# Patient Record
Sex: Male | Born: 1965
Health system: Southern US, Community
[De-identification: ages and names within clinical notes are randomized; demographics above are authoritative.]

## PROBLEM LIST (undated history)

## (undated) DIAGNOSIS — E785 Hyperlipidemia, unspecified: Secondary | ICD-10-CM

## (undated) DIAGNOSIS — Z8669 Personal history of other diseases of the nervous system and sense organs: Secondary | ICD-10-CM

## (undated) DIAGNOSIS — N189 Chronic kidney disease, unspecified: Secondary | ICD-10-CM

## (undated) DIAGNOSIS — I10 Essential (primary) hypertension: Secondary | ICD-10-CM

## (undated) DIAGNOSIS — E119 Type 2 diabetes mellitus without complications: Secondary | ICD-10-CM

## (undated) HISTORY — DX: Chronic kidney disease, unspecified: N18.9

## (undated) HISTORY — DX: Personal history of other diseases of the nervous system and sense organs: Z86.69

## (undated) HISTORY — PX: HERNIA REPAIR: SHX51

## (undated) HISTORY — DX: Hyperlipidemia, unspecified: E78.5

## (undated) HISTORY — PX: TENDON REPAIR: SHX5111

## (undated) HISTORY — PX: KNEE ARTHROSCOPY: SUR90

---

## 1998-07-28 ENCOUNTER — Encounter: Payer: Self-pay | Admitting: Neurosurgery

## 1998-07-28 ENCOUNTER — Inpatient Hospital Stay (HOSPITAL_COMMUNITY): Admission: RE | Admit: 1998-07-28 | Discharge: 1998-07-29 | Payer: Self-pay | Admitting: Neurosurgery

## 2002-10-02 ENCOUNTER — Ambulatory Visit (HOSPITAL_BASED_OUTPATIENT_CLINIC_OR_DEPARTMENT_OTHER): Admission: RE | Admit: 2002-10-02 | Discharge: 2002-10-02 | Payer: Self-pay | Admitting: Orthopedic Surgery

## 2012-12-09 ENCOUNTER — Emergency Department (HOSPITAL_COMMUNITY)
Admission: EM | Admit: 2012-12-09 | Discharge: 2012-12-09 | Disposition: A | Discharge status: Home / Self Care | Payer: PRIVATE HEALTH INSURANCE | Attending: Emergency Medicine | Admitting: Emergency Medicine

## 2012-12-09 ENCOUNTER — Emergency Department (HOSPITAL_COMMUNITY): Payer: PRIVATE HEALTH INSURANCE

## 2012-12-09 ENCOUNTER — Encounter (HOSPITAL_COMMUNITY): Payer: Self-pay | Admitting: Emergency Medicine

## 2012-12-09 DIAGNOSIS — S60312A Abrasion of left thumb, initial encounter: Secondary | ICD-10-CM

## 2012-12-09 DIAGNOSIS — I10 Essential (primary) hypertension: Secondary | ICD-10-CM | POA: Insufficient documentation

## 2012-12-09 DIAGNOSIS — Y9389 Activity, other specified: Secondary | ICD-10-CM | POA: Insufficient documentation

## 2012-12-09 DIAGNOSIS — E119 Type 2 diabetes mellitus without complications: Secondary | ICD-10-CM | POA: Insufficient documentation

## 2012-12-09 DIAGNOSIS — IMO0002 Reserved for concepts with insufficient information to code with codable children: Secondary | ICD-10-CM | POA: Insufficient documentation

## 2012-12-09 DIAGNOSIS — Z23 Encounter for immunization: Secondary | ICD-10-CM | POA: Insufficient documentation

## 2012-12-09 DIAGNOSIS — Y929 Unspecified place or not applicable: Secondary | ICD-10-CM | POA: Insufficient documentation

## 2012-12-09 DIAGNOSIS — W268XXA Contact with other sharp object(s), not elsewhere classified, initial encounter: Secondary | ICD-10-CM | POA: Insufficient documentation

## 2012-12-09 DIAGNOSIS — Z79899 Other long term (current) drug therapy: Secondary | ICD-10-CM | POA: Insufficient documentation

## 2012-12-09 HISTORY — DX: Essential (primary) hypertension: I10

## 2012-12-09 HISTORY — DX: Type 2 diabetes mellitus without complications: E11.9

## 2012-12-09 MED ORDER — TETANUS-DIPHTH-ACELL PERTUSSIS 5-2.5-18.5 LF-MCG/0.5 IM SUSP
0.5000 mL | Freq: Once | INTRAMUSCULAR | Status: AC
  Administered 2012-12-09: 0.5 mL via INTRAMUSCULAR
  Filled 2012-12-09: qty 0.5

## 2012-12-09 NOTE — ED Provider Notes (Signed)
Medical screening examination/treatment/procedure(s) were performed by non-physician practitioner and as supervising physician I was immediately available for consultation/collaboration.  Mikaelah Trostle F Gloriajean Okun, MD 12/09/12 2024 

## 2012-12-09 NOTE — ED Notes (Signed)
Pt was lifting piece of equipment, metal sliver in pad of thumb l/hand

## 2012-12-09 NOTE — ED Provider Notes (Signed)
CSN: [id number]     Arrival date & time 12/09/12  1414 History   First MD Initiated Contact with Patient 12/09/12 1426     Chief Complaint  Patient presents with  . Foreign Body    sliver in metal in thumb  . Extremity Laceration   (Consider location/radiation/quality/duration/timing/severity/associated sxs/prior Treatment) Patient is a 47 y.o. male presenting with foreign body. The history is provided by the patient. No language interpreter was used.  Foreign Body Suspected object:  Metal Associated symptoms comment:  He caught his left thumb in a piece of equipment causing deep abrasion to palmar distal thumb. He presents with concern for metallic foreign body in the wound.   Past Medical History  Diagnosis Date  . Hypertension   . Diabetes mellitus without complication    Past Surgical History  Procedure Laterality Date  . Hernia repair    . Knee arthroscopy    . Tendon repair      r/acilles   Family History  Problem Relation Age of Onset  . Diabetes Mother   . Hyperlipidemia Mother   . Hypertension Mother   . Diabetes Father   . Hyperlipidemia Father   . Hypertension Father   . Cancer Other    History  Substance Use Topics  . Smoking status: Never Smoker   . Smokeless tobacco: Not on file  . Alcohol Use: Yes    Review of Systems  Constitutional: Negative for fever.  Skin: Positive for wound.    Allergies  Bee venom; Ivp dye; and Januvia  Home Medications   Current Outpatient Rx  Name  Route  Sig  Dispense  Refill  . EPINEPHrine (EPIPEN) 0.3 mg/0.3 mL SOAJ injection   Intramuscular   Inject 0.3 mg into the muscle once.         . fenofibrate (TRICOR) 48 MG tablet   Oral   Take 48 mg by mouth at bedtime.         . fish oil-omega-3 fatty acids 1000 MG capsule   Oral   Take 1 g by mouth every morning.         Marland Kitchen lisinopril-hydrochlorothiazide (PRINZIDE,ZESTORETIC) 20-12.5 MG per tablet   Oral   Take 1 tablet by mouth at bedtime.         .  metformin (FORTAMET) 1000 MG (OSM) 24 hr tablet   Oral   Take 1,000 mg by mouth 2 (two) times daily with a meal.         . simvastatin (ZOCOR) 20 MG tablet   Oral   Take 20 mg by mouth at bedtime.          BP 154/83  Pulse 85  Temp(Src) 99.2 F (37.3 C) (Oral)  Resp 16  SpO2 99% Physical Exam  Constitutional: He appears well-developed and well-nourished. No distress.  Pulmonary/Chest: Effort normal.  Skin:  Superficial deep abrasion distal pad of left thumb. No palpable foreign body. No redness or swelling. No active bleeding. FROM.    ED Course  Procedures (including critical care time) Labs Review Labs Reviewed - No data to display Imaging Review Dg Finger Thumb Left  12/09/2012   *RADIOLOGY REPORT*  Clinical Data: Laceration, evaluate for foreign body  LEFT THUMB 2+V  Comparison: None.  Findings: Three views of the thumb demonstrate two tiny linear radiopaque foreign bodies in the superficial soft tissues of the volar surface of the distal thumb pad.  There is no underlying osseous injury or abnormality.  There is mild soft  tissue swelling in the region consistent with the clinical history of laceration.  IMPRESSION:  Two adjacent tiny (1 mm) radiopaque foreign bodies in the superficial soft tissues of the volar surface of the pad of the thumb.  No underlying osseous injury.   Original Report Authenticated By: Malachy Moan, M.D.    MDM  No diagnosis found. 1. Abrasion left thumb  Superficial FB's visualized on x-ray. Wound was cleaned with brush cleanser. Do not suspect deeper foreign body. Tetanus updated. Stable for discharge.     Arnoldo Hooker, PA-C 12/09/12 1532

## 2013-12-30 IMAGING — CR DG FINGER THUMB 2+V*L*
3 series · 3 of 3 positions shown · non-contrast
Comparison: None.

CLINICAL DATA: Laceration, evaluate for foreign body

LEFT THUMB 2+V

[x finger obl left]
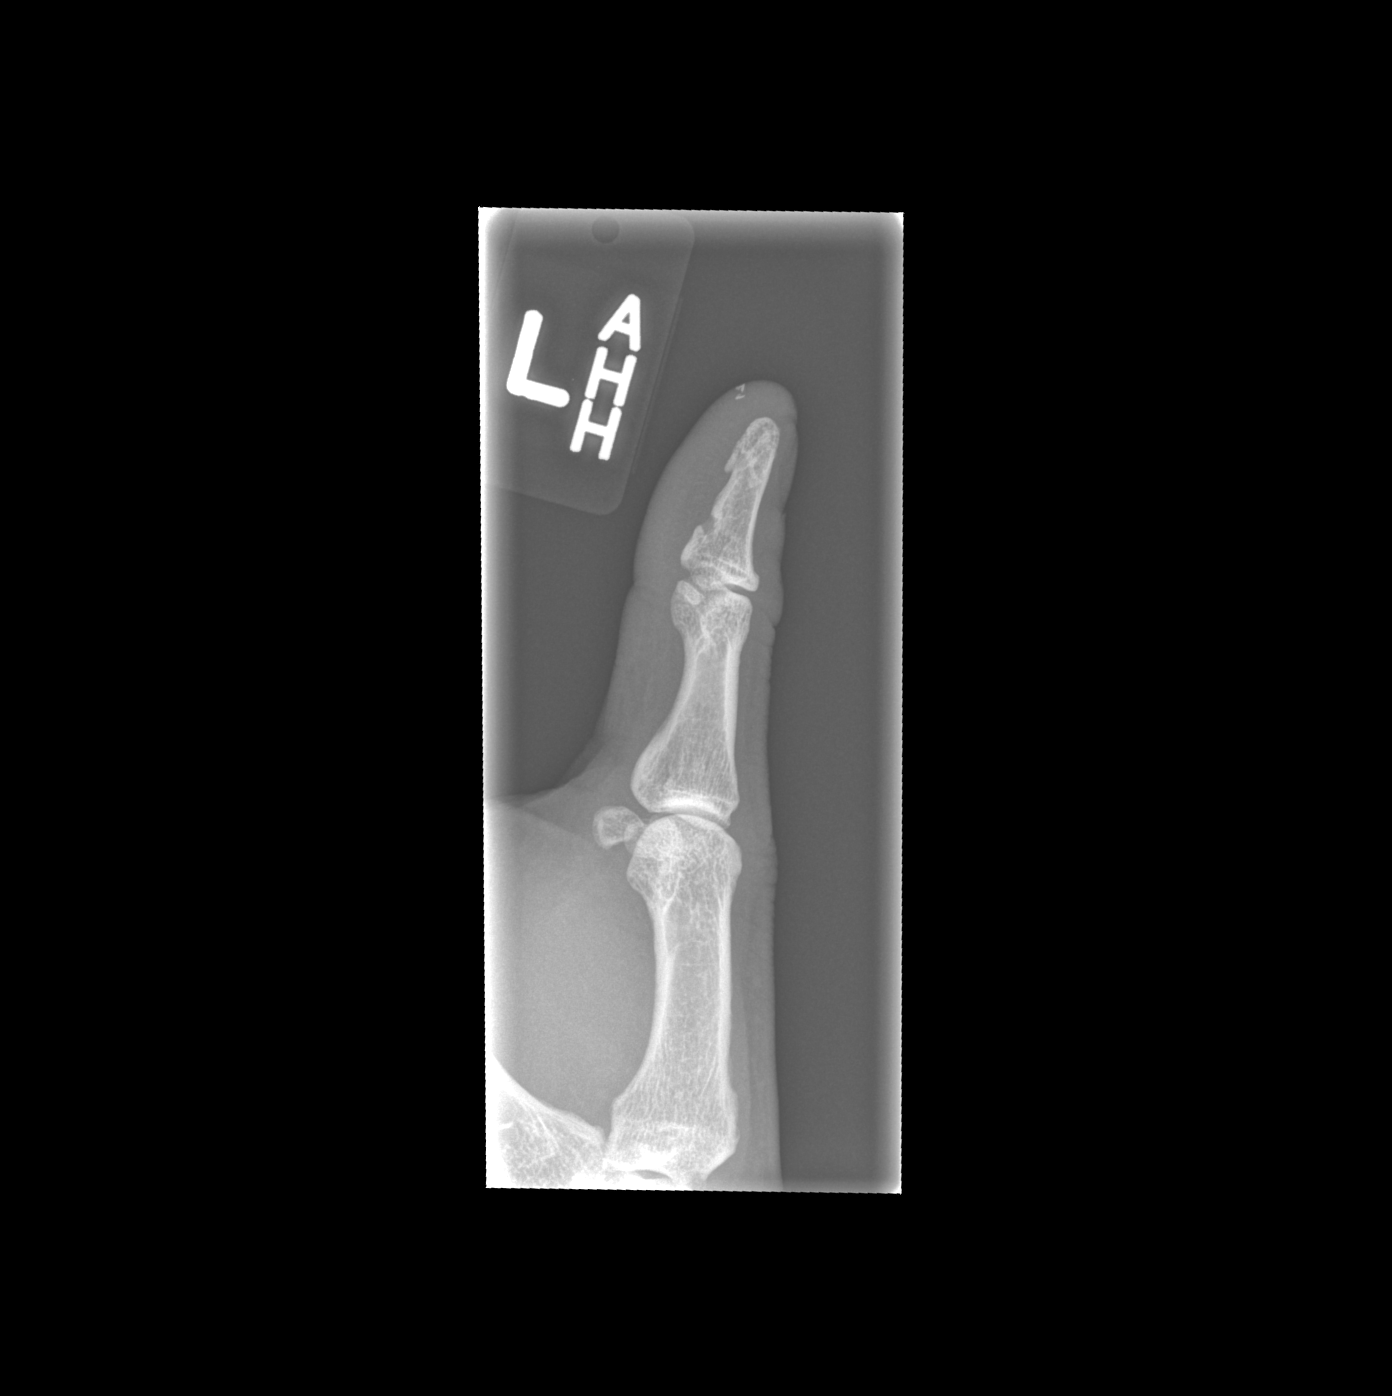

[x finger lat left]
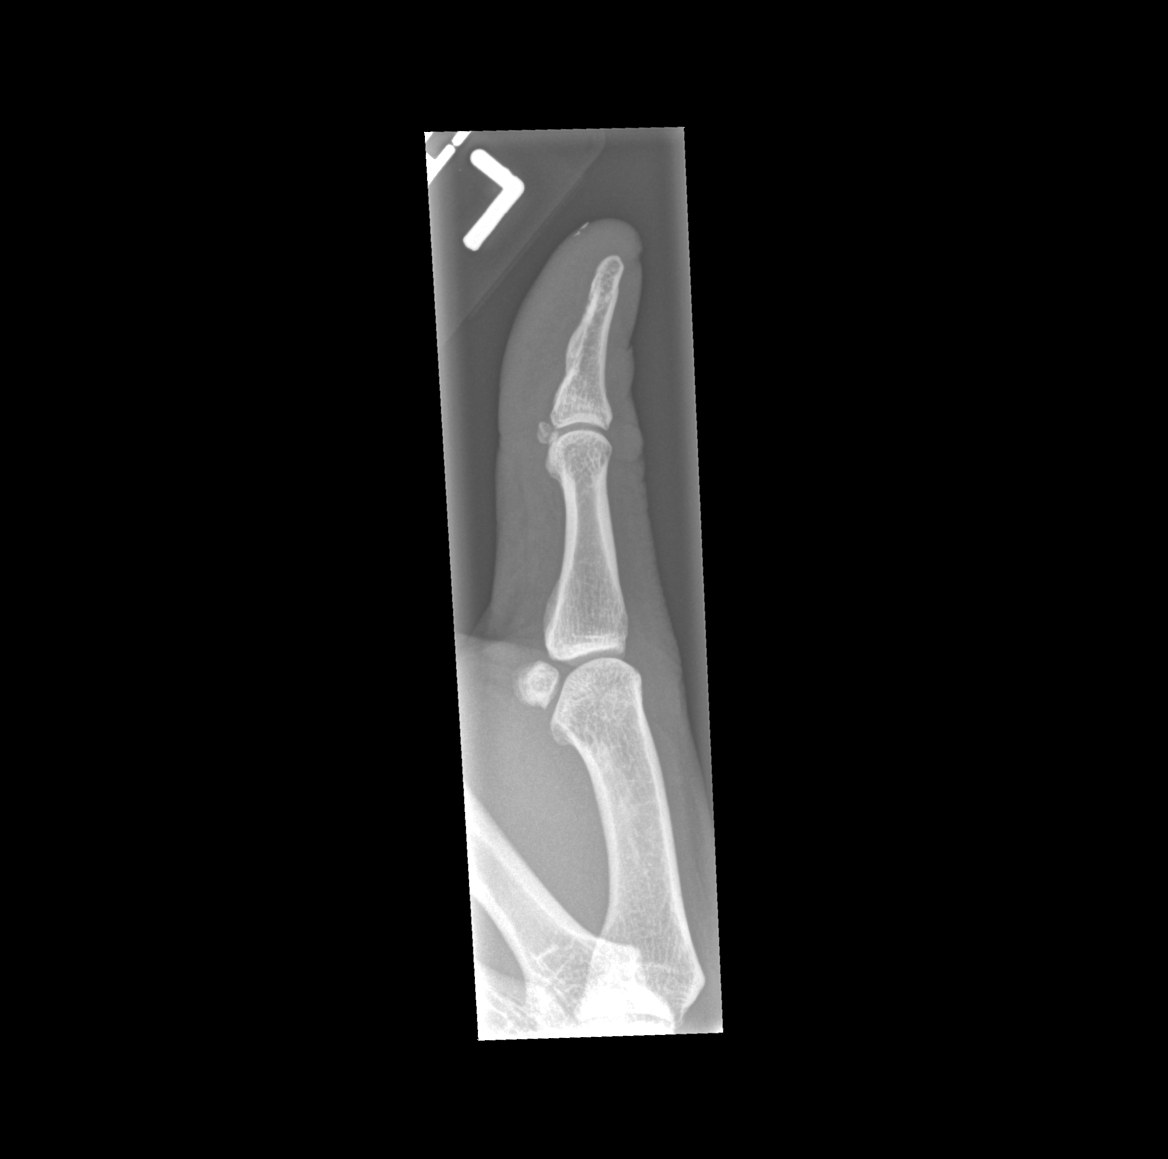

[x finger pa left]
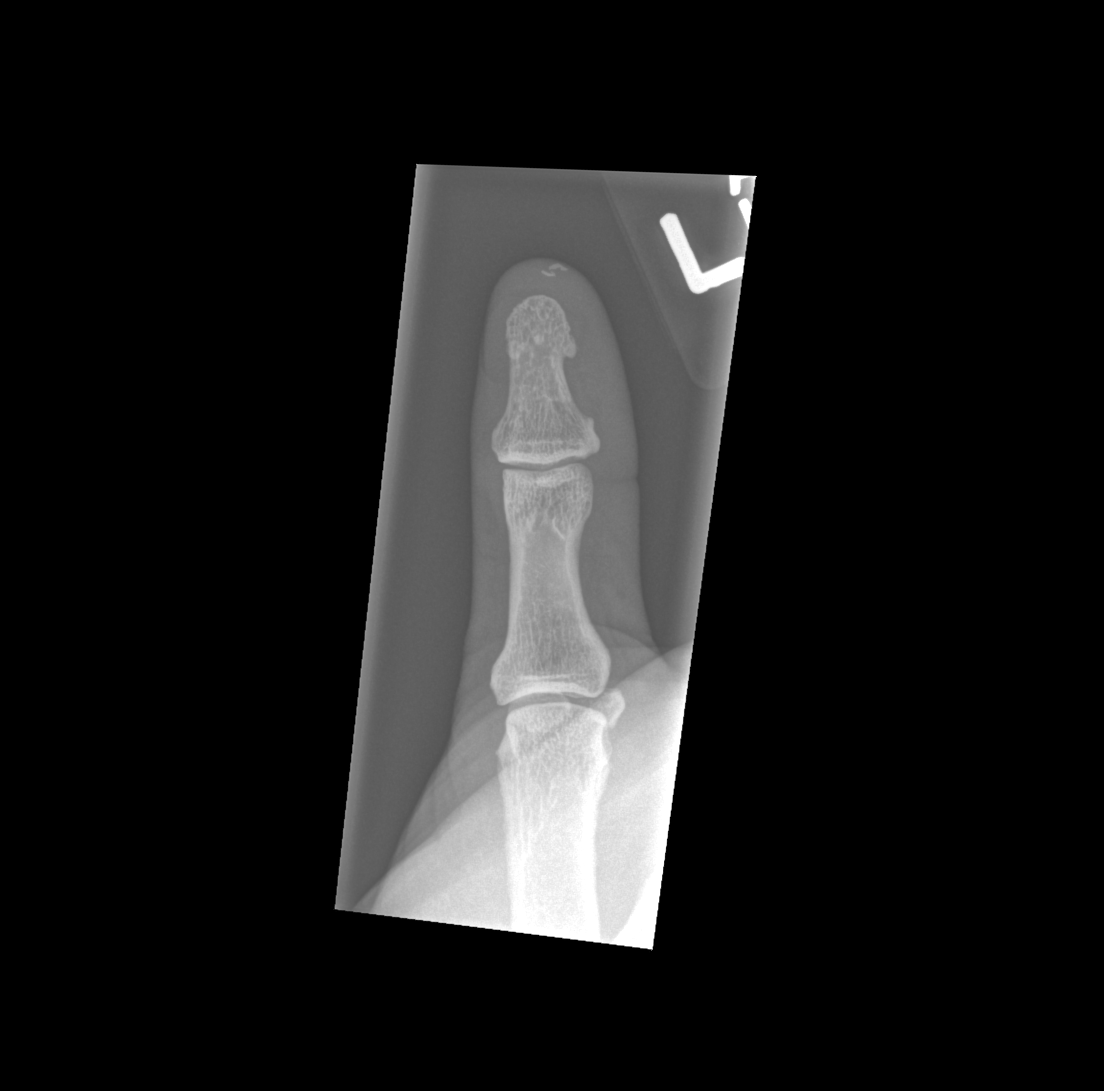

[3 of 3 positions shown; findings below may reference images not displayed]

FINDINGS: Three views of the thumb demonstrate two tiny linear
radiopaque foreign bodies in the superficial soft tissues of the
volar surface of the distal thumb pad.  There is no underlying
osseous injury or abnormality.  There is mild soft tissue swelling
in the region consistent with the clinical history of laceration.
IMPRESSION: Two adjacent tiny (1 mm) radiopaque foreign bodies in the
superficial soft tissues of the volar surface of the pad of the
thumb.

No underlying osseous injury.

## 2014-08-02 LAB — BASIC METABOLIC PANEL
BUN: 23 mg/dL — AB (ref 4–21)
CREATININE: 1.6 mg/dL — AB (ref 0.6–1.3)
GLUCOSE: 164 mg/dL
POTASSIUM: 4.3 mmol/L (ref 3.4–5.3)
SODIUM: 139 mmol/L (ref 137–147)

## 2014-08-02 LAB — HEMOGLOBIN A1C: Hgb A1c MFr Bld: 8.7 % — AB (ref 4.0–6.0)

## 2014-10-07 ENCOUNTER — Encounter: Payer: Self-pay | Admitting: Internal Medicine

## 2014-10-08 ENCOUNTER — Encounter: Payer: Self-pay | Admitting: Internal Medicine

## 2014-10-08 ENCOUNTER — Ambulatory Visit (INDEPENDENT_AMBULATORY_CARE_PROVIDER_SITE_OTHER): Payer: 59 | Admitting: Internal Medicine

## 2014-10-08 ENCOUNTER — Other Ambulatory Visit (INDEPENDENT_AMBULATORY_CARE_PROVIDER_SITE_OTHER): Payer: 59 | Admitting: *Deleted

## 2014-10-08 VITALS — BP 140/74 | HR 93 | Temp 98.2°F | Resp 12 | Ht 70.0 in | Wt 235.0 lb

## 2014-10-08 DIAGNOSIS — N183 Chronic kidney disease, stage 3 unspecified: Secondary | ICD-10-CM

## 2014-10-08 DIAGNOSIS — E1122 Type 2 diabetes mellitus with diabetic chronic kidney disease: Secondary | ICD-10-CM | POA: Diagnosis not present

## 2014-10-08 LAB — POCT GLYCOSYLATED HEMOGLOBIN (HGB A1C): HEMOGLOBIN A1C: 8

## 2014-10-08 MED ORDER — CANAGLIFLOZIN 100 MG PO TABS: 50.0000 mg | ORAL_TABLET | Freq: Every day | ORAL | Status: DC

## 2014-10-08 NOTE — Patient Instructions (Signed)
Please continue: - Metformin XR 1000 mg 2x a day, with meals  Decrease: - Glipizide XL to 10 mg in am  Start: - Invokana 50 mg daily in am  Please return in 1.5 month with your sugar log.   PATIENT INSTRUCTIONS FOR TYPE 2 DIABETES:  **Please join MyChart!** - see attached instructions about how to join if you have not done so already.  DIET AND EXERCISE Diet and exercise is an important part of diabetic treatment.  We recommended aerobic exercise in the form of brisk walking (working between 40-60% of maximal aerobic capacity, similar to brisk walking) for 150 minutes per week (such as 30 minutes five days per week) along with 3 times per week performing 'resistance' training (using various gauge rubber tubes with handles) 5-10 exercises involving the major muscle groups (upper body, lower body and core) performing 10-15 repetitions (or near fatigue) each exercise. Start at half the above goal but build slowly to reach the above goals. If limited by weight, joint pain, or disability, we recommend daily walking in a swimming pool with water up to waist to reduce pressure from joints while allow for adequate exercise.    BLOOD GLUCOSES Monitoring your blood glucoses is important for continued management of your diabetes. Please check your blood glucoses 2-4 times a day: fasting, before meals and at bedtime (you can rotate these measurements - e.g. one day check before the 3 meals, the next day check before 2 of the meals and before bedtime, etc.).   HYPOGLYCEMIA (low blood sugar) Hypoglycemia is usually a reaction to not eating, exercising, or taking too much insulin/ other diabetes drugs.  Symptoms include tremors, sweating, hunger, confusion, headache, etc. Treat IMMEDIATELY with 15 grams of Carbs: . 4 glucose tablets .  cup regular juice/soda . 2 tablespoons raisins . 4 teaspoons sugar . 1 tablespoon honey Recheck blood glucose in 15 mins and repeat above if still symptomatic/blood  glucose <100.  RECOMMENDATIONS TO REDUCE YOUR RISK OF DIABETIC COMPLICATIONS: * Take your prescribed MEDICATION(S) * Follow a DIABETIC diet: Complex carbs, fiber rich foods, (monounsaturated and polyunsaturated) fats * AVOID saturated/trans fats, high fat foods, >2,300 mg salt per day. * EXERCISE at least 5 times a week for 30 minutes or preferably daily.  * DO NOT SMOKE OR DRINK more than 1 drink a day. * Check your FEET every day. Do not wear tightfitting shoes. Contact us if you develop an ulcer * See your EYE doctor once a year or more if needed * Get a FLU shot once a year * Get a PNEUMONIA vaccine once before and once after age 51 years  GOALS:  * Your Hemoglobin A1c of <7%  * fasting sugars need to be <130 * after meals sugars need to be <180 (2h after you start eating) * Your Systolic BP should be 140 or lower  * Your Diastolic BP should be 80 or lower  * Your HDL (Good Cholesterol) should be 40 or higher  * Your LDL (Bad Cholesterol) should be 100 or lower. * Your Triglycerides should be 150 or lower  * Your Urine microalbumin (kidney function) should be <30 * Your Body Mass Index should be 25 or lower    Please consider the following ways to cut down carbs and fat and increase fiber and micronutrients in your diet: - substitute whole grain for white bread or pasta - substitute brown rice for white rice - substitute 90-calorie flat bread pieces for slices of bread when possible -  substitute sweet potatoes or yams for white potatoes - substitute humus for margarine - substitute tofu for cheese when possible - substitute almond or rice milk for regular milk (would not drink soy milk daily due to concern for soy estrogen influence on breast cancer risk) - substitute dark chocolate for other sweets when possible - substitute water - can add lemon or orange slices for taste - for diet sodas (artificial sweeteners will trick your body that you can eat sweets without getting  calories and will lead you to overeating and weight gain in the long run) - do not skip breakfast or other meals (this will slow down the metabolism and will result in more weight gain over time)  - can try smoothies made from fruit and almond/rice milk in am instead of regular breakfast - can also try old-fashioned (not instant) oatmeal made with almond/rice milk in am - order the dressing on the side when eating salad at a restaurant (pour less than half of the dressing on the salad) - eat as little meat as possible - can try juicing, but should not forget that juicing will get rid of the fiber, so would alternate with eating raw veg./fruits or drinking smoothies - use as little oil as possible, even when using olive oil - can dress a salad with a mix of balsamic vinegar and lemon juice, for e.g. - use agave nectar, stevia sugar, or regular sugar rather than artificial sweateners - steam or broil/roast veggies  - snack on veggies/fruit/nuts (unsalted, preferably) when possible, rather than processed foods - reduce or eliminate aspartame in diet (it is in diet sodas, chewing gum, etc) Read the labels!  Try to read Dr. Katherina Right book: "Program for Reversing Diabetes" for other ideas for healthy eating.

## 2014-10-08 NOTE — Progress Notes (Signed)
Patient ID: Jonathan Dalton, male   DOB: 10/28/1965, 49 y.o.   MRN: [id number]  HPI: Jonathan BarkerWilliam B Hitchner is a 49 y.o.-year-old male, referred by his PCP, Dr. Deatra JamesVyvyan Sun, for management of DM2, dx in 1994, non-insulin-dependent, uncontrolled, with complications (CKD stage 3).  Last hemoglobin A1c was: Lab Results  Component Value Date   HGBA1C 8.7* 08/02/2014  He started to increase his exercise after the above HbA1c.  Pt is on a regimen of: - Metformin XR 1000 mg 2x a day, with meals - Glipizide XL 10 mg >> increased to 2x a day in 07/2014 >> some low CBGs (hot, shaky).  He tried Januvia 2013 >> GERD, lack of appetite.  Pt checks his sugars 1-2x a day and they are: - am: 76-120s, 140s - 2h after b'fast: n/c - before lunch: n/c - 2h after lunch: n/c - before dinner: 90s-130 - 2h after dinner: n/c - bedtime: n/c - nighttime: n/c No lows. Lowest sugar was 76; ? hypoglycemia awareness. Highest sugar was 251.  Glucometer: ReliOn  Pt's meals are: - Breakfast: oatmeal + bacon or cold cereal + 2% milk - Lunch: sandwich or dinner leftovers - Dinner: meat + veggies + starch - Snacks: fruit x2 He lost 5 lbs since 07/2014. He exercises 3-4x a week.  - + CKD stage 3, last BUN/creatinine:  Lab Results  Component Value Date   BUN 23* 08/02/2014   CREATININE 1.6* 08/02/2014  GFR 55 On Lisinopril 20. - last set of lipids: 05/03/2014: 154/193/26/90  On Simvastatin 20. - last eye exam was in 2012. ? DR, but in 2010 >> sent to see a retina specialist.  - no numbness and tingling in his feet.  Pt has FH of DM in PGF, MGGF, cousins.   He also has HTN, HL.  ROS: Constitutional: no weight gain/loss, no fatigue, no subjective hyperthermia/hypothermia Eyes: no blurry vision, no xerophthalmia ENT: no sore throat, no nodules palpated in throat, no dysphagia/odynophagia, no hoarseness Cardiovascular: no CP/SOB/palpitations/leg swelling Respiratory: no cough/SOB Gastrointestinal: no  N/V/D/C Musculoskeletal: no muscle/joint aches Skin: no rashes Neurological: no tremors/numbness/tingling/dizziness Psychiatric: no depression/anxiety  Past Medical History  Diagnosis Date  . Hypertension   . Diabetes mellitus without complication   . Hyperlipidemia   . History of seizures as a child   . Chronic kidney disease     Stage III    Past Surgical History  Procedure Laterality Date  . Hernia repair    . Knee arthroscopy    . Tendon repair      r/acilles   History   Social History  . Marital Status: Married    Spouse Name: N/A  . Number of Children: 2   Occupational History  . Nurse [employer]   Social History Main Topics  . Smoking status: Never Smoker   . Smokeless tobacco: No  . Alcohol Use: Yes, beer, liquor 2-3x a week - 3 drinks  . Drug Use: No   Current Outpatient Prescriptions on File Prior to Visit  Medication Sig Dispense Refill  . Beclomethasone Dipropionate (QNASL) 80 MCG/ACT AERS     . EPINEPHrine (EPIPEN) 0.3 mg/0.3 mL SOAJ injection Inject 0.3 mg into the muscle once.    . fenofibrate (TRICOR) 48 MG tablet Take 145 mg by mouth at bedtime.     . fish oil-omega-3 fatty acids 1000 MG capsule Take 1 g by mouth every morning.    Marland Kitchen. glipiZIDE (GLUCOTROL XL) 10 MG 24 hr tablet Take 10 mg  by mouth 2 (two) times daily with a meal.    . lisinopril-hydrochlorothiazide (PRINZIDE,ZESTORETIC) 20-12.5 MG per tablet Take 1 tablet by mouth at bedtime.    . metformin (FORTAMET) 1000 MG (OSM) 24 hr tablet Take 1,000 mg by mouth 2 (two) times daily with a meal.    . simvastatin (ZOCOR) 20 MG tablet Take 20 mg by mouth at bedtime.     No current facility-administered medications on file prior to visit.   Allergies  Allergen Reactions  . Bee Venom Anaphylaxis  . Ivp Dye [Iodinated Diagnostic Agents] Hives  . Januvia [Sitagliptin] Other (See Comments)    Induces acid reflux, no appetite   . Fluorescein Rash   Family History  Problem Relation Age of Onset   . Diabetes Mother   . Hyperlipidemia Mother   . Hypertension Mother   . Diabetes Father   . Hyperlipidemia Father   . Hypertension Father   . Cancer Other   . Hypertension Maternal Grandmother   . Hyperlipidemia Maternal Grandmother   . Cancer Maternal Grandmother     Lung cancer  . Hyperlipidemia Maternal Grandfather   . Hypertension Maternal Grandfather   . CVA Maternal Grandfather   . Cancer Maternal Grandfather     Lung cancer  . Heart disease Paternal Grandmother     MI   . Stroke Paternal Grandmother   . Hypertension Paternal Grandmother   . Diabetes Paternal Grandfather   . Hyperlipidemia Paternal Grandfather    PE: BP 140/74 mmHg  Pulse 93  Temp(Src) 98.2 F (36.8 C) (Oral)  Resp 12  Ht  (1.778 m)  Wt 235 lb (106.595 kg)  BMI 33.72 kg/m2  SpO2 98% Wt Readings from Last 3 Encounters:  10/08/14 235 lb (106.595 kg)   Constitutional: overweight, in NAD Eyes: PERRLA, EOMI, no exophthalmos ENT: moist mucous membranes, no thyromegaly, no cervical lymphadenopathy Cardiovascular: RRR (no tachycardia at the end of the visit), No MRG Respiratory: CTA B Gastrointestinal: abdomen soft, NT, ND, BS+ Musculoskeletal: no deformities, strength intact in all 4 Skin: moist, warm, no rashes Neurological: no tremor with outstretched hands, DTR normal in all 4  ASSESSMENT: 1. DM2, non-insulin-dependent, uncontrolled, with complications - CKD stage 3  - ? DR  PLAN:  1. Patient with long-standing, uncontrolled diabetes, on oral antidiabetic regimen, which became insufficient. He percieves low CBGs after increasing Glipizide and increasing is exercise >> will decrease Glipizide XL to once a day and add Invokana - lower dose 2/2 CKD. I would have liked to add Tradjenta but he felt poorly on Januvia in the past. I strongly encouraged him to continue exercise and being mindful of his diet. - I suggested to:  Patient Instructions  Please continue: - Metformin XR 1000 mg 2x  a day, with meals  Decrease: - Glipizide XL to 10 mg in am  Start: - Invokana 50 mg daily in am  Please return in 1.5 month with your sugar log.   - we discussed about SEs of Invokana, which are: dizziness (advised to be careful when stands from sitting position), decreased BP - usually not < normal (BP today is not low), and fungal UTIs (advised to let me know if develops one).  - at next visit, will check BMP and increase Invokana dose to 100 mg daily if unchanged - advised to schedule a new appt with Dr Elmer Picker (ophthalm.) - advised him to start checking sugars at different times of the day - check 2 times a day, rotating checks -  given sugar log and advised how to fill it and to bring it at next appt  - given foot care handout and explained the principles  - given instructions for hypoglycemia management "15-15 rule"  - checked HbA1c today >> 8% (decreased!) - Return to clinic in 1.5 mo with sugar log

## 2014-11-20 ENCOUNTER — Ambulatory Visit (INDEPENDENT_AMBULATORY_CARE_PROVIDER_SITE_OTHER): Payer: 59 | Admitting: Internal Medicine

## 2014-11-20 ENCOUNTER — Encounter: Payer: Self-pay | Admitting: Internal Medicine

## 2014-11-20 VITALS — BP 144/74 | HR 77 | Temp 98.1°F | Resp 12 | Wt 234.0 lb

## 2014-11-20 DIAGNOSIS — N183 Chronic kidney disease, stage 3 unspecified: Secondary | ICD-10-CM

## 2014-11-20 DIAGNOSIS — E1122 Type 2 diabetes mellitus with diabetic chronic kidney disease: Secondary | ICD-10-CM | POA: Diagnosis not present

## 2014-11-20 MED ORDER — GLIPIZIDE ER 5 MG PO TB24: 5.0000 mg | ORAL_TABLET | Freq: Every day | ORAL | Status: DC

## 2014-11-20 NOTE — Progress Notes (Signed)
Patient ID: Jonathan Dalton, male   DOB: 1965/09/23, 49 y.o.   MRN: [id number]  HPI: Jonathan Dalton is a 49 y.o.-year-old male, returning for f/u for DM2, dx in 1994, non-insulin-dependent, uncontrolled, with complications (CKD stage 3). Last visit 1.5 mo ago.  Last hemoglobin A1c was: Lab Results  Component Value Date   HGBA1C 8.0 10/08/2014   HGBA1C 8.7* 08/02/2014  He started to increase his exercise after the above HbA1c.  Pt is on a regimen of: - Metformin XR 1000 mg 2x a day, with meals - Glipizide XL 10 mg >> increased to 2x a day in 07/2014 >> some low CBGs (hot, shaky) >> decreased to 10 mg in am 09/2014 - Invokana 50 mg daily He tried Januvia 2013 >> GERD, lack of appetite.  Pt checks his sugars 1-2x a day and they are more fluctuating - more lows, then high CBGs from correction of the lows: - am: 76-120s, 140s >> 48-140, 150 - 2h after b'fast: n/c - before lunch: n/c >> 60-124 - 2h after lunch: n/c >> 126, 210 - before dinner: 90s-130 >> 41, 79 - 2h after dinner: n/c - bedtime: n/c - nighttime: n/c No lows. Lowest sugar was 76 >> 41; ? hypoglycemia awareness. Highest sugar was 251 >> 275x1 (overcorrection of a low).  Glucometer: ReliOn  Pt's meals are: - Breakfast: oatmeal + bacon or cold cereal + 2% milk - Lunch: sandwich or dinner leftovers - Dinner: meat + veggies + starch - Snacks: fruit x2 He exercises 3-4x a week.  - + CKD stage 3, last BUN/creatinine:  Lab Results  Component Value Date   BUN 23* 08/02/2014   CREATININE 1.6* 08/02/2014  GFR 55 On Lisinopril 20. - last set of lipids: 05/03/2014: 154/193/26/90  On Simvastatin 20. - last eye exam was in 2012. ? DR, but in 2010 >> sent to see retina specialist. He has one scheduled. - no numbness and tingling in his feet.  He also has HTN, HL.  ROS: Constitutional: no weight gain/loss, no fatigue, no subjective hyperthermia/hypothermia Eyes: no blurry vision, no xerophthalmia ENT: no sore throat, no  nodules palpated in throat, no dysphagia/odynophagia, no hoarseness Cardiovascular: no CP/SOB/palpitations/leg swelling Respiratory: no cough/SOB Gastrointestinal: no N/V/D/C Musculoskeletal: no muscle/joint aches Skin: no rashes Neurological: no tremors/numbness/tingling/dizziness  I reviewed pt's medications, allergies, PMH, social hx, family hx, and changes were documented in the history of present illness. Otherwise, unchanged from my initial visit note.  Past Medical History  Diagnosis Date  . Hypertension   . Diabetes mellitus without complication   . Hyperlipidemia   . History of seizures as a child   . Chronic kidney disease     Stage III    Past Surgical History  Procedure Laterality Date  . Hernia repair    . Knee arthroscopy    . Tendon repair      r/acilles   History   Social History  . Marital Status: Married    Spouse Name: N/A  . Number of Children: 2   Occupational History  . Nurse [employer]   Social History Main Topics  . Smoking status: Never Smoker   . Smokeless tobacco: No  . Alcohol Use: Yes, beer, liquor 2-3x a week - 3 drinks  . Drug Use: No   Current Outpatient Prescriptions on File Prior to Visit  Medication Sig Dispense Refill  . Beclomethasone Dipropionate (QNASL) 80 MCG/ACT AERS     . canagliflozin (INVOKANA) 100 MG TABS tablet  Take 0.5 tablets (50 mg total) by mouth daily. 30 tablet 2  . EPINEPHrine (EPIPEN) 0.3 mg/0.3 mL SOAJ injection Inject 0.3 mg into the muscle once.    . fenofibrate (TRICOR) 48 MG tablet Take 145 mg by mouth at bedtime.     . fish oil-omega-3 fatty acids 1000 MG capsule Take 1 g by mouth every morning.    Marland Kitchen glipiZIDE (GLUCOTROL XL) 10 MG 24 hr tablet Take 10 mg by mouth daily before breakfast.    . lisinopril-hydrochlorothiazide (PRINZIDE,ZESTORETIC) 20-12.5 MG per tablet Take 1 tablet by mouth at bedtime.    . metformin (FORTAMET) 1000 MG (OSM) 24 hr tablet Take 1,000 mg by mouth 2 (two) times daily with a meal.     . Probiotic Product (PROBIOTIC DAILY) CAPS Take 1 capsule by mouth daily.    . simvastatin (ZOCOR) 20 MG tablet Take 20 mg by mouth at bedtime.     No current facility-administered medications on file prior to visit.   Allergies  Allergen Reactions  . Bee Venom Anaphylaxis  . Ivp Dye [Iodinated Diagnostic Agents] Hives  . Januvia [Sitagliptin] Other (See Comments)    Induces acid reflux, no appetite   . Fluorescein Rash   Family History  Problem Relation Age of Onset  . Diabetes Mother   . Hyperlipidemia Mother   . Hypertension Mother   . Diabetes Father   . Hyperlipidemia Father   . Hypertension Father   . Cancer Other   . Hypertension Maternal Grandmother   . Hyperlipidemia Maternal Grandmother   . Cancer Maternal Grandmother     Lung cancer  . Hyperlipidemia Maternal Grandfather   . Hypertension Maternal Grandfather   . CVA Maternal Grandfather   . Cancer Maternal Grandfather     Lung cancer  . Heart disease Paternal Grandmother     MI   . Stroke Paternal Grandmother   . Hypertension Paternal Grandmother   . Diabetes Paternal Grandfather   . Hyperlipidemia Paternal Grandfather    PE: BP 144/74 mmHg  Pulse 77  Temp(Src) 98.1 F (36.7 C) (Oral)  Resp 12  Wt 234 lb (106.142 kg)  SpO2 97% Body mass index is 33.58 kg/(m^2). Wt Readings from Last 3 Encounters:  11/20/14 234 lb (106.142 kg)  10/08/14 235 lb (106.595 kg)   Constitutional: overweight, in NAD Eyes: PERRLA, EOMI, no exophthalmos ENT: moist mucous membranes, no thyromegaly, no cervical lymphadenopathy Cardiovascular: RRR, No MRG Respiratory: CTA B Gastrointestinal: abdomen soft, NT, ND, BS+ Musculoskeletal: no deformities, strength intact in all 4 Skin: moist, warm, no rashes Neurological: no tremor with outstretched hands, DTR normal in all 4  ASSESSMENT: 1. DM2, non-insulin-dependent, uncontrolled, with complications - CKD stage 3  - ? DR  PLAN:  1. Patient with long-standing,  uncontrolled diabetes, on oral antidiabetic regimen, with fluctuating CBGs - he has low CBGs <50 >> will reduce Glipizide further. For now, there is no need to increase Invokana.  - I suggested to:  Patient Instructions  Please continue: - Metformin XR 1000 mg 2x a day, with meals - Invokana 50 mg daily in am  Decrease: - Glipizide XL to 5 mg in am  Please return in 2 months with your sugar log.   Call me with your sugars in 2-3 weeks.  Please return for labs tomorrow am.  - will check BMP tomorrow (no lab tech today)  - has a scheduled appt with Dr Elmer Picker (ophthalm.) - continue checking sugars at different times of the day - check  2 times a day, rotating checks - checked HbA1c today >> 8% (decreased!) - Return to clinic in 2 mo with sugar log  Orders Placed This Encounter  Procedures  . BASIC METABOLIC PANEL WITH GFR   Component     Latest Ref Rng 08/02/2014 11/21/2014  Sodium     135 - 145 mEq/L 139 138  Potassium     3.5 - 5.1 mEq/L 4.3 4.2  Chloride     96 - 112 mEq/L  105  CO2     19 - 32 mEq/L  23  Glucose     70 - 99 mg/dL 161 096 (H)  BUN     6 - 23 mg/dL 23 (A) 41 (H)  Creatinine     0.40 - 1.50 mg/dL 1.6 (A) 0.45 (H)  Calcium     8.4 - 10.5 mg/dL  9.7  GFR     >40.98 mL/min  51.68 (L)   Creatinine is a little higher. I would like to repeat these at next visit, for now I will advise him to stay well hydrated.

## 2014-11-20 NOTE — Patient Instructions (Addendum)
Please continue: - Metformin XR 1000 mg 2x a day, with meals - Invokana 50 mg daily in am  Decrease: - Glipizide XL to 5 mg in am  Please return in 2 months with your sugar log.   Call me with your sugars in 2-3 weeks.  Please return for labs tomorrow am.

## 2014-11-21 ENCOUNTER — Other Ambulatory Visit: Payer: Self-pay | Admitting: *Deleted

## 2014-11-21 ENCOUNTER — Other Ambulatory Visit (INDEPENDENT_AMBULATORY_CARE_PROVIDER_SITE_OTHER): Payer: 59

## 2014-11-21 DIAGNOSIS — E1122 Type 2 diabetes mellitus with diabetic chronic kidney disease: Secondary | ICD-10-CM

## 2014-11-21 DIAGNOSIS — N183 Chronic kidney disease, stage 3 unspecified: Secondary | ICD-10-CM

## 2014-11-21 LAB — BASIC METABOLIC PANEL
BUN: 41 mg/dL — ABNORMAL HIGH (ref 6–23)
CHLORIDE: 105 meq/L (ref 96–112)
CO2: 23 mEq/L (ref 19–32)
Calcium: 9.7 mg/dL (ref 8.4–10.5)
Creatinine, Ser: 1.79 mg/dL — ABNORMAL HIGH (ref 0.40–1.50)
GFR: 51.68 mL/min — AB (ref >60.00)
Glucose, Bld: 114 mg/dL — ABNORMAL HIGH (ref 70–99)
POTASSIUM: 4.2 meq/L (ref 3.5–5.1)
Sodium: 138 mEq/L (ref 135–145)

## 2015-01-22 ENCOUNTER — Other Ambulatory Visit: Payer: Self-pay | Admitting: *Deleted

## 2015-01-22 ENCOUNTER — Ambulatory Visit (INDEPENDENT_AMBULATORY_CARE_PROVIDER_SITE_OTHER): Payer: 59 | Admitting: Internal Medicine

## 2015-01-22 ENCOUNTER — Encounter: Payer: Self-pay | Admitting: Internal Medicine

## 2015-01-22 VITALS — BP 118/62 | HR 68 | Temp 98.0°F | Resp 12 | Wt 228.6 lb

## 2015-01-22 DIAGNOSIS — N183 Chronic kidney disease, stage 3 unspecified: Secondary | ICD-10-CM

## 2015-01-22 DIAGNOSIS — E1122 Type 2 diabetes mellitus with diabetic chronic kidney disease: Secondary | ICD-10-CM

## 2015-01-22 LAB — BASIC METABOLIC PANEL
BUN: 29 mg/dL — AB (ref 6–23)
CHLORIDE: 107 meq/L (ref 96–112)
CO2: 23 mEq/L (ref 19–32)
CREATININE: 1.33 mg/dL (ref 0.40–1.50)
Calcium: 9.4 mg/dL (ref 8.4–10.5)
GFR: 72.76 mL/min (ref >60.00)
Glucose, Bld: 144 mg/dL — ABNORMAL HIGH (ref 70–99)
Potassium: 4.2 mEq/L (ref 3.5–5.1)
Sodium: 140 mEq/L (ref 135–145)

## 2015-01-22 LAB — HEMOGLOBIN A1C: HEMOGLOBIN A1C: 7.2 % — AB (ref 4.6–6.5)

## 2015-01-22 NOTE — Progress Notes (Signed)
Patient ID: Jonathan Dalton, male   DOB: 08-17-1965, 49 y.o.   MRN: [id number]  HPI: Jonathan Dalton is a 49 y.o.-year-old male, returning for f/u for DM2, dx in 1994, non-insulin-dependent, uncontrolled, with complications (CKD stage 3). Last visit 2 mo ago.  Last hemoglobin A1c was: Lab Results  Component Value Date   HGBA1C 8.0 10/08/2014   HGBA1C 8.7* 08/02/2014  He started to increase his exercise after the above HbA1c.  Pt is on a regimen of: - Metformin XR 1000 mg 2x a day, with meals - Glipizide XL 10 mg >> 5 mg in am - Invokana 50 mg daily He tried Januvia 2013 >> GERD, lack of appetite.  Pt checks his sugars 1-2x a day: - am: 76-120s, 140s >> 48-140, 150 >> 85-138, 155 - 2h after b'fast: n/c - before lunch: n/c >> 60-124 >> n/c - 2h after lunch: n/c >> 126, 210 >> n/c - before dinner: 90s-130 >> 41, 79 >> 51x1 - 2h after dinner: n/c - bedtime: n/c - nighttime: n/c No lows. Lowest sugar was 76 >> 41 >> 51 (exertion, skipped a meal); ? hypoglycemia awareness. Highest sugar was 251 >> 275x1 (overcorrection of a low) >> 176 Glucometer: ReliOn  Pt's meals are: - Breakfast: oatmeal + bacon or cold cereal + 2% milk - Lunch: sandwich or dinner leftovers - Dinner: meat + veggies + starch - Snacks: fruit x2 He exercises 3-4x a week.  - + CKD stage 3, last BUN/creatinine:  Lab Results  Component Value Date   BUN 41* 11/21/2014   CREATININE 1.79* 11/21/2014  GFR 55 On Lisinopril 20. - last set of lipids: 05/03/2014: 154/193/26/90  On Simvastatin 20. - last eye exam was in 2012. ? DR, but in 2010 >> sent to see retina specialist. He has one scheduled. - no numbness and tingling in his feet.  He also has HTN, HL.  ROS: Constitutional: no weight gain/loss, no fatigue, no subjective hyperthermia/hypothermia Eyes: no blurry vision, no xerophthalmia ENT: no sore throat, no nodules palpated in throat, no dysphagia/odynophagia, no hoarseness Cardiovascular: no  CP/SOB/palpitations/leg swelling Respiratory: no cough/SOB Gastrointestinal: no N/V/D/C Musculoskeletal: no muscle/joint aches Skin: no rashes Neurological: no tremors/numbness/tingling/dizziness  I reviewed pt's medications, allergies, PMH, social hx, family hx, and changes were documented in the history of present illness. Otherwise, unchanged from my initial visit note.  Past Medical History  Diagnosis Date  . Hypertension   . Diabetes mellitus without complication (HCC)   . Hyperlipidemia   . History of seizures as a child   . Chronic kidney disease     Stage III    Past Surgical History  Procedure Laterality Date  . Hernia repair    . Knee arthroscopy    . Tendon repair      r/acilles   History   Social History  . Marital Status: Married    Spouse Name: N/A  . Number of Children: 2   Occupational History  . Nurse [employer]   Social History Main Topics  . Smoking status: Never Smoker   . Smokeless tobacco: No  . Alcohol Use: Yes, beer, liquor 2-3x a week - 3 drinks  . Drug Use: No   Current Outpatient Prescriptions on File Prior to Visit  Medication Sig Dispense Refill  . canagliflozin (INVOKANA) 100 MG TABS tablet Take 0.5 tablets (50 mg total) by mouth daily. 30 tablet 2  . EPINEPHrine (EPIPEN) 0.3 mg/0.3 mL SOAJ injection Inject 0.3 mg into the muscle  once.    . fenofibrate (TRICOR) 48 MG tablet Take 145 mg by mouth at bedtime.     . fish oil-omega-3 fatty acids 1000 MG capsule Take 1 g by mouth every morning.    Marland Kitchen glipiZIDE (GLIPIZIDE XL) 5 MG 24 hr tablet Take 1 tablet (5 mg total) by mouth daily with breakfast. 60 tablet 1  . lisinopril-hydrochlorothiazide (PRINZIDE,ZESTORETIC) 20-12.5 MG per tablet Take 1 tablet by mouth at bedtime.    . metformin (FORTAMET) 1000 MG (OSM) 24 hr tablet Take 1,000 mg by mouth 2 (two) times daily with a meal.    . Probiotic Product (PROBIOTIC DAILY) CAPS Take 1 capsule by mouth daily.    . simvastatin (ZOCOR) 20 MG tablet  Take 20 mg by mouth at bedtime.    . Beclomethasone Dipropionate (QNASL) 80 MCG/ACT AERS      No current facility-administered medications on file prior to visit.   Allergies  Allergen Reactions  . Bee Venom Anaphylaxis  . Ivp Dye [Iodinated Diagnostic Agents] Hives  . Januvia [Sitagliptin] Other (See Comments)    Induces acid reflux, no appetite   . Fluorescein Rash   Family History  Problem Relation Age of Onset  . Diabetes Mother   . Hyperlipidemia Mother   . Hypertension Mother   . Diabetes Father   . Hyperlipidemia Father   . Hypertension Father   . Cancer Other   . Hypertension Maternal Grandmother   . Hyperlipidemia Maternal Grandmother   . Cancer Maternal Grandmother     Lung cancer  . Hyperlipidemia Maternal Grandfather   . Hypertension Maternal Grandfather   . CVA Maternal Grandfather   . Cancer Maternal Grandfather     Lung cancer  . Heart disease Paternal Grandmother     MI   . Stroke Paternal Grandmother   . Hypertension Paternal Grandmother   . Diabetes Paternal Grandfather   . Hyperlipidemia Paternal Grandfather    PE: BP 118/62 mmHg  Pulse 68  Temp(Src) 98 F (36.7 C) (Oral)  Resp 12  Wt 228 lb 9.6 oz (103.692 kg)  SpO2 98% Body mass index is 32.8 kg/(m^2). Wt Readings from Last 3 Encounters:  01/22/15 228 lb 9.6 oz (103.692 kg)  11/20/14 234 lb (106.142 kg)  10/08/14 235 lb (106.595 kg)   Constitutional: overweight, in NAD Eyes: PERRLA, EOMI, no exophthalmos ENT: moist mucous membranes, no thyromegaly, no cervical lymphadenopathy Cardiovascular: RRR, No MRG Respiratory: CTA B Gastrointestinal: abdomen soft, NT, ND, BS+ Musculoskeletal: no deformities, strength intact in all 4 Skin: moist, warm, no rashes Neurological: no tremor with outstretched hands, DTR normal in all 4  ASSESSMENT: 1. DM2, non-insulin-dependent, uncontrolled, with complications - CKD stage 3  - ? DR  PLAN:  1. Patient with long-standing, uncontrolled diabetes, on  oral antidiabetic regimen, with improved CBGs (no more lows, except x1 at 51 after exertion and delaying a meal. He is not checking CBGs later in the day >> advised him to start doing so. - I suggested to:  Patient Instructions  Please continue: - Metformin XR 1000 mg 2x a day, with meals - Invokana 50 mg daily in am - Glipizide XL to 5 mg in am  Please return in 3 months with your sugar log.   Check sugars later in the day, too.  - will check BMP (as Cr was higher at last visit) and HbA1c today - has a scheduled appt with Dr Elmer Picker (ophthalm.) - continue checking sugars at different times of the day - check  1 time a day, rotating checks - Return to clinic in 3 mo with sugar log   Office Visit on 01/22/2015  Component Date Value Ref Range Status  . Sodium 01/22/2015 140  135 - 145 mEq/L Final  . Potassium 01/22/2015 4.2  3.5 - 5.1 mEq/L Final  . Chloride 01/22/2015 107  96 - 112 mEq/L Final  . CO2 01/22/2015 23  19 - 32 mEq/L Final  . Glucose, Bld 01/22/2015 144* 70 - 99 mg/dL Final  . BUN 16/10/960410/26/2016 29* 6 - 23 mg/dL Final  . Creatinine, Ser 01/22/2015 1.33  0.40 - 1.50 mg/dL Final  . Calcium 54/09/811910/26/2016 9.4  8.4 - 10.5 mg/dL Final  . GFR 14/78/295610/26/2016 72.76  >60.00 mL/min Final  . Hgb A1c MFr Bld 01/22/2015 7.2* 4.6 - 6.5 % Final   Glycemic Control Guidelines for People with Diabetes:Non Diabetic:  <6%Goal of Therapy: <7%Additional Action Suggested:  >8%    CR improved >> continue Invokana.

## 2015-01-22 NOTE — Patient Instructions (Signed)
Please continue: - Metformin XR 1000 mg 2x a day, with meals - Invokana 50 mg daily in am - Glipizide XL 5 mg in am  Please return in 3 months with your sugar log.   Check sugars later in the day, too.

## 2015-03-31 DIAGNOSIS — E083212 Diabetes mellitus due to underlying condition with mild nonproliferative diabetic retinopathy with macular edema, left eye: Secondary | ICD-10-CM | POA: Diagnosis not present

## 2015-03-31 DIAGNOSIS — H34832 Tributary (branch) retinal vein occlusion, left eye, with macular edema: Secondary | ICD-10-CM | POA: Diagnosis not present

## 2015-03-31 DIAGNOSIS — H2513 Age-related nuclear cataract, bilateral: Secondary | ICD-10-CM | POA: Diagnosis not present

## 2015-03-31 DIAGNOSIS — E113291 Type 2 diabetes mellitus with mild nonproliferative diabetic retinopathy without macular edema, right eye: Secondary | ICD-10-CM | POA: Diagnosis not present

## 2015-03-31 LAB — HM DIABETES EYE EXAM

## 2015-04-01 ENCOUNTER — Other Ambulatory Visit: Payer: Self-pay | Admitting: Internal Medicine

## 2015-04-01 MED FILL — glipiZIDE XL 5 MG TB24: 5 | 30 days supply | Qty: 30 | Fill #1

## 2015-04-01 MED FILL — INVOKANA 100 MG TABLET: 100 | 30 days supply | Qty: 15 | Fill #0

## 2015-04-24 ENCOUNTER — Encounter: Payer: Self-pay | Admitting: Internal Medicine

## 2015-04-24 MED FILL — SIMVASTATIN 20 MG TABLET: 20 | 90 days supply | Qty: 90 | Fill #1

## 2015-04-24 MED FILL — LISINOPRIL-HCTZ 20-12.5 MG: 20-12.5 | 90 days supply | Qty: 180 | Fill #1

## 2015-04-25 ENCOUNTER — Encounter: Payer: Self-pay | Admitting: Internal Medicine

## 2015-04-25 ENCOUNTER — Ambulatory Visit (INDEPENDENT_AMBULATORY_CARE_PROVIDER_SITE_OTHER): Payer: 59 | Admitting: Internal Medicine

## 2015-04-25 ENCOUNTER — Other Ambulatory Visit (INDEPENDENT_AMBULATORY_CARE_PROVIDER_SITE_OTHER): Payer: 59 | Admitting: *Deleted

## 2015-04-25 ENCOUNTER — Telehealth: Payer: Self-pay | Admitting: *Deleted

## 2015-04-25 VITALS — BP 114/60 | HR 68 | Temp 98.0°F | Resp 12 | Wt 228.0 lb

## 2015-04-25 DIAGNOSIS — N183 Chronic kidney disease, stage 3 (moderate): Secondary | ICD-10-CM

## 2015-04-25 DIAGNOSIS — E1122 Type 2 diabetes mellitus with diabetic chronic kidney disease: Secondary | ICD-10-CM | POA: Diagnosis not present

## 2015-04-25 DIAGNOSIS — H43822 Vitreomacular adhesion, left eye: Secondary | ICD-10-CM | POA: Diagnosis not present

## 2015-04-25 DIAGNOSIS — E113293 Type 2 diabetes mellitus with mild nonproliferative diabetic retinopathy without macular edema, bilateral: Secondary | ICD-10-CM | POA: Diagnosis not present

## 2015-04-25 DIAGNOSIS — H43813 Vitreous degeneration, bilateral: Secondary | ICD-10-CM | POA: Diagnosis not present

## 2015-04-25 LAB — POCT GLYCOSYLATED HEMOGLOBIN (HGB A1C): Hemoglobin A1C: 7.1

## 2015-04-25 MED ORDER — METFORMIN HCL ER (OSM) 1000 MG PO TB24: 1000.0000 mg | ORAL_TABLET | Freq: Two times a day (BID) | ORAL | Status: DC

## 2015-04-25 MED ORDER — CANAGLIFLOZIN 100 MG PO TABS: ORAL_TABLET | ORAL | Status: DC

## 2015-04-25 MED ORDER — METFORMIN HCL ER 500 MG PO TB24: 1000.0000 mg | ORAL_TABLET | Freq: Two times a day (BID) | ORAL | Status: DC

## 2015-04-25 MED ORDER — GLIPIZIDE ER 5 MG PO TB24: 5.0000 mg | ORAL_TABLET | Freq: Every day | ORAL | Status: DC

## 2015-04-25 MED FILL — glipiZIDE XL 5 MG TB24: 5 | 90 days supply | Qty: 90 | Fill #0

## 2015-04-25 MED FILL — METFORMIN HCL ER 500 MG TAB: 500 | 30 days supply | Qty: 120 | Fill #0

## 2015-04-25 NOTE — Progress Notes (Signed)
Patient ID: Jonathan Dalton, male   DOB: 11-Feb-1966, 50 y.o.   MRN: [id number]  HPI: Jonathan Dalton is a 50 y.o.-year-old male, returning for f/u for DM2, dx in 1994, non-insulin-dependent, uncontrolled, with complications (CKD stage 3, mild DR). Last visit 3 mo ago.  Last hemoglobin A1c was: Lab Results  Component Value Date   HGBA1C 7.2* 01/22/2015   HGBA1C 8.0 10/08/2014   HGBA1C 8.7* 08/02/2014   Pt is on a regimen of: - Metformin XR 1000 mg 2x a day, with meals - Glipizide XL 10 mg >> 5 mg in am - Invokana 50 mg daily He tried Januvia 2013 >> GERD, lack of appetite.  Pt checks his sugars 1-2x a day: - am: 76-120s, 140s >> 48-140, 150 >> 85-138, 155 >> 95-120 - 2h after b'fast: n/c - before lunch: n/c >> 60-124 >> n/c >> 90-130, 148 - 2h after lunch: n/c >> 126, 210 >> n/c - before dinner: 90s-130 >> 41, 79 >> 51x1 >> 97-130, 141 - 2h after dinner: n/c - bedtime: n/c - nighttime: n/c No lows. Lowest sugar was 76 >> 41 >> 51 (exertion, skipped a meal); ? hypoglycemia awareness. Highest sugar was 251 >> 275x1 (overcorrection of a low) >> 176 >> 160s. Glucometer: ReliOn  Pt's meals are: - Breakfast: oatmeal + bacon or cold cereal + 2% milk - Lunch: sandwich or dinner leftovers - Dinner: meat + veggies + starch - Snacks: fruit x2 He exercises 3-4x a week.  - + CKD stage 3, last BUN/creatinine:  Lab Results  Component Value Date   BUN 29* 01/22/2015   CREATININE 1.33 01/22/2015  GFR 55 On Lisinopril 20. - last set of lipids: No results found for: CHOL, HDL, LDLCALC, LDLDIRECT, TRIG, CHOLHDL 05/03/2014: 154/193/26/90  On Simvastatin 20. - last eye exam was: Component Date Value  . HM Diabetic Eye Exam 03/31/2015 Retinopathy* (mild)  He will see retina specialist at [facility]. - no numbness and tingling in his feet.  He also has HTN, HL.  ROS: Constitutional: no weight gain/loss, no fatigue, no subjective hyperthermia/hypothermia Eyes: no blurry  vision, no xerophthalmia ENT: no sore throat, no nodules palpated in throat, no dysphagia/odynophagia, no hoarseness Cardiovascular: no CP/SOB/palpitations/leg swelling Respiratory: no cough/SOB Gastrointestinal: no N/V/D/C Musculoskeletal: no muscle/joint aches Skin: no rashes Neurological: no tremors/numbness/tingling/dizziness  I reviewed pt's medications, allergies, PMH, social hx, family hx, and changes were documented in the history of present illness. Otherwise, unchanged from my initial visit note.  Past Medical History  Diagnosis Date  . Hypertension   . Diabetes mellitus without complication (HCC)   . Hyperlipidemia   . History of seizures as a child   . Chronic kidney disease     Stage III    Past Surgical History  Procedure Laterality Date  . Hernia repair    . Knee arthroscopy    . Tendon repair      r/acilles   History   Social History  . Marital Status: Married    Spouse Name: N/A  . Number of Children: 2   Occupational History  . Nurse [employer]   Social History Main Topics  . Smoking status: Never Smoker   . Smokeless tobacco: No  . Alcohol Use: Yes, beer, liquor 2-3x a week - 3 drinks  . Drug Use: No   Current Outpatient Prescriptions on File Prior to Visit  Medication Sig Dispense Refill  . EPINEPHrine (EPIPEN) 0.3 mg/0.3 mL SOAJ injection Inject 0.3 mg into  the muscle once.    . fenofibrate (TRICOR) 48 MG tablet Take 145 mg by mouth at bedtime.     . fish oil-omega-3 fatty acids 1000 MG capsule Take 1 g by mouth every morning.    Marland Kitchen glipiZIDE (GLIPIZIDE XL) 5 MG 24 hr tablet Take 1 tablet (5 mg total) by mouth daily with breakfast. 60 tablet 1  . INVOKANA 100 MG TABS tablet TAKE 1/2 TABLET BY MOUTH DAILY. 30 tablet 1  . lisinopril-hydrochlorothiazide (PRINZIDE,ZESTORETIC) 20-12.5 MG per tablet Take 1 tablet by mouth at bedtime.    . metformin (FORTAMET) 1000 MG (OSM) 24 hr tablet Take 1,000 mg by mouth 2 (two) times daily with a meal.    .  Probiotic Product (PROBIOTIC DAILY) CAPS Take 1 capsule by mouth daily.    . simvastatin (ZOCOR) 20 MG tablet Take 20 mg by mouth at bedtime.    . Beclomethasone Dipropionate (QNASL) 80 MCG/ACT AERS      No current facility-administered medications on file prior to visit.   Allergies  Allergen Reactions  . Bee Venom Anaphylaxis  . Ivp Dye [Iodinated Diagnostic Agents] Hives  . Januvia [Sitagliptin] Other (See Comments)    Induces acid reflux, no appetite   . Fluorescein Rash   Family History  Problem Relation Age of Onset  . Diabetes Mother   . Hyperlipidemia Mother   . Hypertension Mother   . Diabetes Father   . Hyperlipidemia Father   . Hypertension Father   . Cancer Other   . Hypertension Maternal Grandmother   . Hyperlipidemia Maternal Grandmother   . Cancer Maternal Grandmother     Lung cancer  . Hyperlipidemia Maternal Grandfather   . Hypertension Maternal Grandfather   . CVA Maternal Grandfather   . Cancer Maternal Grandfather     Lung cancer  . Heart disease Paternal Grandmother     MI   . Stroke Paternal Grandmother   . Hypertension Paternal Grandmother   . Diabetes Paternal Grandfather   . Hyperlipidemia Paternal Grandfather    PE: BP 114/60 mmHg  Pulse 68  Temp(Src) 98 F (36.7 C) (Oral)  Resp 12  Wt 228 lb (103.42 kg)  SpO2 97% Body mass index is 32.71 kg/(m^2). Wt Readings from Last 3 Encounters:  04/25/15 228 lb (103.42 kg)  01/22/15 228 lb 9.6 oz (103.692 kg)  11/20/14 234 lb (106.142 kg)   Constitutional: overweight, in NAD Eyes: PERRLA, EOMI, no exophthalmos ENT: moist mucous membranes, no thyromegaly, no cervical lymphadenopathy Cardiovascular: RRR, No MRG Respiratory: CTA B Gastrointestinal: abdomen soft, NT, ND, BS+ Musculoskeletal: no deformities, strength intact in all 4 Skin: moist, warm, no rashes Neurological: no tremor with outstretched hands, DTR normal in all 4  ASSESSMENT: 1. DM2, non-insulin-dependent, uncontrolled, with  complications - CKD stage 3  - + mild DR  PLAN:  1. Patient with long-standing, uncontrolled diabetes, on oral antidiabetic regimen, with improved CBGs >> almost all sugars at goal! Will not change regimen. - I suggested to:  Patient Instructions  Please continue: - Metformin XR 1000 mg 2x a day, with meals - Invokana 50 mg daily in am - Glipizide XL 5 mg in am  Please return in 3 months with your sugar log.   - will check HbA1c today >> 7.1% (great!) - has a scheduled appt with a retina specialist  - continue checking sugars at different times of the day - check 1 time a day, rotating checks - Return to clinic in 3 mo with sugar log

## 2015-04-25 NOTE — Patient Instructions (Signed)
Please continue: - Metformin XR 1000 mg 2x a day, with meals - Invokana 50 mg daily in am - Glipizide XL 5 mg in am  Please return in 3 months with your sugar log.

## 2015-04-25 NOTE — Telephone Encounter (Signed)
Absolutely.

## 2015-04-25 NOTE — Telephone Encounter (Signed)
Pharmacist with [facility] called asking if pt can continue taking Metformin XR , 2 tablets 2 times daily? The cost is $12.00 where as the Fortamet (Metforming)  2 times daily is over $2200.00. Please advise.

## 2015-04-25 NOTE — Telephone Encounter (Signed)
Sent!

## 2015-04-29 MED FILL — INVOKANA 100 MG TABLET: 100 | 45 days supply | Qty: 45 | Fill #0

## 2015-05-12 DIAGNOSIS — I10 Essential (primary) hypertension: Secondary | ICD-10-CM | POA: Diagnosis not present

## 2015-05-12 DIAGNOSIS — E785 Hyperlipidemia, unspecified: Secondary | ICD-10-CM | POA: Diagnosis not present

## 2015-06-02 MED FILL — METFORMIN HCL ER 500 MG TAB: 500 | 30 days supply | Qty: 120 | Fill #1

## 2015-07-03 MED FILL — METFORMIN HCL ER 500 MG TAB: 500 | 30 days supply | Qty: 120 | Fill #2

## 2015-07-23 MED FILL — LISINOPRIL-HCTZ 20-12.5 MG: 20-12.5 | 90 days supply | Qty: 180 | Fill #0

## 2015-07-24 ENCOUNTER — Encounter: Payer: Self-pay | Admitting: Internal Medicine

## 2015-07-24 ENCOUNTER — Other Ambulatory Visit (INDEPENDENT_AMBULATORY_CARE_PROVIDER_SITE_OTHER): Payer: 59 | Admitting: *Deleted

## 2015-07-24 ENCOUNTER — Ambulatory Visit (INDEPENDENT_AMBULATORY_CARE_PROVIDER_SITE_OTHER): Payer: 59 | Admitting: Internal Medicine

## 2015-07-24 VITALS — BP 130/74 | HR 73 | Temp 98.1°F | Resp 12 | Wt 228.6 lb

## 2015-07-24 DIAGNOSIS — N183 Chronic kidney disease, stage 3 unspecified: Secondary | ICD-10-CM

## 2015-07-24 DIAGNOSIS — E1122 Type 2 diabetes mellitus with diabetic chronic kidney disease: Secondary | ICD-10-CM

## 2015-07-24 LAB — POCT GLYCOSYLATED HEMOGLOBIN (HGB A1C): HEMOGLOBIN A1C: 7.2

## 2015-07-24 MED ORDER — METFORMIN HCL ER 500 MG PO TB24: 1000.0000 mg | ORAL_TABLET | Freq: Two times a day (BID) | ORAL | Status: DC

## 2015-07-24 NOTE — Progress Notes (Signed)
Patient ID: Jonathan Dalton, male   DOB: [birth year], 50 y.o.   MRN: [id number]  HPI: Jonathan Dalton is a 50 y.o.-year-old male, returning for f/u for DM2, dx in 1994, non-insulin-dependent, uncontrolled, with complications (CKD stage 3, mild DR). Last visit 3 mo ago.  Last hemoglobin A1c was: Lab Results  Component Value Date   HGBA1C 7.1 04/25/2015   HGBA1C 7.2* 01/22/2015   HGBA1C 8.0 10/08/2014   Pt is on a regimen of: - Metformin XR 1000 mg 2x a day, with meals - Glipizide XL 10 mg >> 5 mg in am - Invokana 50 mg daily He tried Januvia 2013 >> GERD, lack of appetite.  Pt checks his sugars 1-2x a day: - am: 76-120s, 140s >> 48-140, 150 >> 85-138, 155 >> 95-120 >> 99-115, 149 - 2h after b'fast: n/c >> 88-102 - before lunch: n/c >> 60-124 >> n/c >> 90-130, 148 >> 88-106, 120, 140 - 2h after lunch: n/c >> 126, 210 >> n/c >> 100-108 - before dinner: 90s-130 >> 41, 79 >> 51x1 >> 97-130, 141 >> 99-112 - 2h after dinner: n/c - bedtime: n/c - nighttime: n/c No lows. Lowest sugar was 76 >> 41 >> 51 (exertion, skipped a meal) >> 88; + hypoglycemia awareness in the 80s. Highest sugar was 251 >> 275x1 (overcorrection of a low) >> 176 >> 160s >> 190-200. Glucometer: ReliOn  Pt's meals are: - Breakfast: oatmeal + bacon or cold cereal + 2% milk - Lunch: sandwich or dinner leftovers - Dinner: meat + veggies + starch - Snacks: fruit x2 He exercises 3-4x a week.  - + CKD stage 3, last BUN/creatinine:  Lab Results  Component Value Date   BUN 29* 01/22/2015   CREATININE 1.33 01/22/2015  On Lisinopril 20. - last set of lipids: No results found for: CHOL, HDL, LDLCALC, LDLDIRECT, TRIG, CHOLHDL 05/03/2014: 154/193/26/90  On Simvastatin 20. - last eye exam was: Component Date Value  . HM Diabetic Eye Exam 03/31/2015 Retinopathy* (mild)  He saw retina specialist at [facility].+ L cataract.  - no numbness and tingling in his feet.  He also has HTN, HL.  ROS: Constitutional:  no weight gain/loss, no fatigue, no subjective hyperthermia/hypothermia Eyes: no blurry vision, no xerophthalmia ENT: no sore throat, no nodules palpated in throat, no dysphagia/odynophagia, no hoarseness Cardiovascular: no CP/SOB/palpitations/leg swelling Respiratory: no cough/SOB Gastrointestinal: no N/V/D/C Musculoskeletal: no muscle/joint aches Skin: no rashes Neurological: no tremors/numbness/tingling/dizziness  I reviewed pt's medications, allergies, PMH, social hx, family hx, and changes were documented in the history of present illness. Otherwise, unchanged from my initial visit note.  Past Medical History  Diagnosis Date  . Hypertension   . Diabetes mellitus without complication (HCC)   . Hyperlipidemia   . History of seizures as a child   . Chronic kidney disease     Stage III    Past Surgical History  Procedure Laterality Date  . Hernia repair    . Knee arthroscopy    . Tendon repair      r/acilles   History   Social History  . Marital Status: Married    Spouse Name: N/A  . Number of Children: 2   Occupational History  . Nurse [employer]   Social History Main Topics  . Smoking status: Never Smoker   . Smokeless tobacco: No  . Alcohol Use: Yes, beer, liquor 2-3x a week - 3 drinks  . Drug Use: No   Current Outpatient Prescriptions on File Prior  to Visit  Medication Sig Dispense Refill  . canagliflozin (INVOKANA) 100 MG TABS tablet Please take 1/2 tablet daily in am. 45 tablet 1  . EPINEPHrine (EPIPEN) 0.3 mg/0.3 mL SOAJ injection Inject 0.3 mg into the muscle once.    . fenofibrate (TRICOR) 48 MG tablet Take 145 mg by mouth at bedtime.     . fish oil-omega-3 fatty acids 1000 MG capsule Take 1 g by mouth every morning.    Marland Kitchen glipiZIDE (GLIPIZIDE XL) 5 MG 24 hr tablet Take 1 tablet (5 mg total) by mouth daily with breakfast. 90 tablet 1  . lisinopril-hydrochlorothiazide (PRINZIDE,ZESTORETIC) 20-12.5 MG per tablet Take 1 tablet by mouth at bedtime.    .  metFORMIN (GLUCOPHAGE XR) 500 MG 24 hr tablet Take 2 tablets (1,000 mg total) by mouth 2 (two) times daily. 120 tablet 2  . Probiotic Product (PROBIOTIC DAILY) CAPS Take 1 capsule by mouth daily.    . simvastatin (ZOCOR) 20 MG tablet Take 20 mg by mouth at bedtime.    . Beclomethasone Dipropionate (QNASL) 80 MCG/ACT AERS      No current facility-administered medications on file prior to visit.   Allergies  Allergen Reactions  . Bee Venom Anaphylaxis  . Ivp Dye [Iodinated Diagnostic Agents] Hives  . Januvia [Sitagliptin] Other (See Comments)    Induces acid reflux, no appetite   . Fluorescein Rash   Family History  Problem Relation Age of Onset  . Diabetes Mother   . Hyperlipidemia Mother   . Hypertension Mother   . Diabetes Father   . Hyperlipidemia Father   . Hypertension Father   . Cancer Other   . Hypertension Maternal Grandmother   . Hyperlipidemia Maternal Grandmother   . Cancer Maternal Grandmother     Lung cancer  . Hyperlipidemia Maternal Grandfather   . Hypertension Maternal Grandfather   . CVA Maternal Grandfather   . Cancer Maternal Grandfather     Lung cancer  . Heart disease Paternal Grandmother     MI   . Stroke Paternal Grandmother   . Hypertension Paternal Grandmother   . Diabetes Paternal Grandfather   . Hyperlipidemia Paternal Grandfather    PE: BP 130/74 mmHg  Pulse 73  Temp(Src) 98.1 F (36.7 C) (Oral)  Resp 12  Wt 228 lb 9.6 oz (103.692 kg)  SpO2 97% Body mass index is 32.8 kg/(m^2). Wt Readings from Last 3 Encounters:  07/24/15 228 lb 9.6 oz (103.692 kg)  04/25/15 228 lb (103.42 kg)  01/22/15 228 lb 9.6 oz (103.692 kg)   Constitutional: overweight, in NAD Eyes: PERRLA, EOMI, no exophthalmos ENT: moist mucous membranes, no thyromegaly, no cervical lymphadenopathy Cardiovascular: RRR, No MRG Respiratory: CTA B Gastrointestinal: abdomen soft, NT, ND, BS+ Musculoskeletal: no deformities, strength intact in all 4 Skin: moist, warm, no  rashes Neurological: no tremor with outstretched hands, DTR normal in all 4  ASSESSMENT: 1. DM2, non-insulin-dependent, uncontrolled, with complications - CKD stage 3  - + mild DR  PLAN:  1. Patient with long-standing, uncontrolled diabetes, on oral antidiabetic regimen, with great CBGs >> almost all sugars at goal! Will not change regimen, but, since HbA1c is higher than expected = 7.2% (slightly higher than last value), I will check a fructosamine level. - I suggested to:  Patient Instructions  Please continue: - Metformin XR 1000 mg 2x a day, with meals - Invokana 50 mg daily in am - Glipizide XL 5 mg in am  Please return in 3 months with your sugar log.  Please stop at the lab.  - will try to obtain Lipid panel from PCP - continue checking sugars at different times of the day - check 1 time a day, rotating checks - Return to clinic in 3 mo with sugar log   Orders Only on 07/24/2015  Component Date Value Ref Range Status  . Hemoglobin A1C 07/24/2015 7.2   Final  Office Visit on 07/24/2015  Component Date Value Ref Range Status  . Fructosamine 07/24/2015 241  0 - 285 umol/L Final   Comment: Published reference interval for apparently healthy subjects between age 52 and 7460 is 1205 - 285 umol/L and in a poorly controlled diabetic population is 228 - 563 umol/L with a mean of 396 umol/L.    The hemoglobin A1c calculated from fructosamine is much lower than the one measured, at 5.7%.

## 2015-07-24 NOTE — Patient Instructions (Signed)
Please continue: - Metformin XR 1000 mg 2x a day, with meals - Invokana 50 mg daily in am - Glipizide XL 5 mg in am  Please return in 3 months with your sugar log.   Please stop at the lab.

## 2015-07-25 LAB — FRUCTOSAMINE: Fructosamine: 241 umol/L (ref 0–285)

## 2015-07-31 MED FILL — glipiZIDE XL 5 MG TB24: 5 | 90 days supply | Qty: 90 | Fill #1

## 2015-07-31 MED FILL — INVOKANA 100 MG TABLET: 100 | 45 days supply | Qty: 45 | Fill #1

## 2015-07-31 MED FILL — METFORMIN HCL ER 500 MG TAB: 500 | 90 days supply | Qty: 360 | Fill #0

## 2015-08-06 ENCOUNTER — Other Ambulatory Visit: Payer: Self-pay | Admitting: *Deleted

## 2015-08-06 MED ORDER — EMPAGLIFLOZIN 25 MG PO TABS: 25.0000 mg | ORAL_TABLET | Freq: Every day | ORAL | Status: DC

## 2015-08-06 NOTE — Telephone Encounter (Signed)
Per Dr Jonathan Dalton switch to Jardiance 25 mg. Ins no longer covers Invokana.

## 2015-08-14 MED FILL — SIMVASTATIN 20 MG TABLET: 20 | 90 days supply | Qty: 90 | Fill #0

## 2015-10-23 ENCOUNTER — Ambulatory Visit (INDEPENDENT_AMBULATORY_CARE_PROVIDER_SITE_OTHER): Payer: 59 | Admitting: Internal Medicine

## 2015-10-23 VITALS — BP 140/84 | HR 71 | Wt 229.4 lb

## 2015-10-23 DIAGNOSIS — N183 Chronic kidney disease, stage 3 (moderate): Secondary | ICD-10-CM

## 2015-10-23 DIAGNOSIS — E1122 Type 2 diabetes mellitus with diabetic chronic kidney disease: Secondary | ICD-10-CM

## 2015-10-23 MED ORDER — METFORMIN HCL ER 500 MG PO TB24: 1000.0000 mg | ORAL_TABLET | Freq: Two times a day (BID) | ORAL | 1 refills | Status: DC

## 2015-10-23 MED ORDER — GLIPIZIDE ER 5 MG PO TB24: 5.0000 mg | ORAL_TABLET | Freq: Every day | ORAL | 1 refills | Status: DC

## 2015-10-23 MED ORDER — EMPAGLIFLOZIN 25 MG PO TABS: 25.0000 mg | ORAL_TABLET | Freq: Every day | ORAL | 11 refills | Status: DC

## 2015-10-23 MED FILL — LISINOPRIL-HCTZ 20-12.5 MG: 20-12.5 | 90 days supply | Qty: 180 | Fill #1

## 2015-10-23 MED FILL — JARDIANCE 25 MG TABLET: 25 | 30 days supply | Qty: 30 | Fill #0

## 2015-10-23 MED FILL — glipiZIDE XL 5 MG TB24: 5 | 90 days supply | Qty: 90 | Fill #0

## 2015-10-23 MED FILL — METFORMIN HCL ER 500 MG TAB: 500 | 90 days supply | Qty: 360 | Fill #0

## 2015-10-23 NOTE — Patient Instructions (Addendum)
Please continue: - Metformin XR 1000 mg 2x a day, with meals - Jardiance 25 mg daily in am - Glipizide XL 5 mg in am  Please return in 3-4 months with your sugar log.   Please ask Dr Wynelle Link to send me your lab results from next visit.  Please stop at the lab.

## 2015-10-23 NOTE — Progress Notes (Signed)
Patient ID: Jonathan Dalton, male   DOB: 02-11-1966, 50 y.o.   MRN: [id number]  HPI: Jonathan Dalton is a 50 y.o.-year-old male, returning for f/u for DM2, dx in 1994, non-insulin-dependent, uncontrolled, with complications (CKD stage 3, mild DR). Last visit 3 mo ago.  Last hemoglobin A1c was: 07/24/2015: The hemoglobin A1c calculated from fructosamine is much lower than the one measured, at 5.7%. Lab Results  Component Value Date   HGBA1C 7.2 07/24/2015   HGBA1C 7.1 04/25/2015   HGBA1C 7.2 (H) 01/22/2015   Pt is on a regimen of: - Metformin XR 1000 mg 2x a day, with meals - Glipizide XL 10 mg >> 5 mg in am - Invokana 50 mg daily He tried Januvia 2013 >> GERD, lack of appetite.  Pt checks his sugars 1-2x a day: - am: 76-120s, 140s >> 48-140, 150 >> 85-138, 155 >> 95-120 >> 99-115, 149 >> 101-130, 155 - 2h after b'fast: n/c >> 88-102 - before lunch: n/c >> 60-124 >> n/c >> 90-130, 148 >> 88-106, 120, 140 >> 65, 95-120 - 2h after lunch: n/c >> 126, 210 >> n/c >> 100-108 >> 112 - before dinner: 90s-130 >> 41, 79 >> 51x1 >> 97-130, 141 >> 99-112 >> 101-120 - 2h after dinner: n/c >> 130, 140-160 - bedtime: n/c - nighttime: n/c No lows. Lowest sugar was 76 >> 41 >> 51 (exertion, skipped a meal) >> 88 >> 65x1; + hypoglycemia awareness in the 80s. Highest sugar was 251 >> 275x1 (overcorrection of a low) >> 176 >> 160s >> 190-200 >> 202 (sick). Glucometer: ReliOn  Pt's meals are: - Breakfast: oatmeal + bacon or cold cereal + 2% milk - Lunch: sandwich or dinner leftovers - Dinner: meat + veggies + starch - Snacks: fruit x2 He exercises 3-4x a week.  - + CKD stage 3, last BUN/creatinine:  Lab Results  Component Value Date   BUN 29 (H) 01/22/2015   CREATININE 1.33 01/22/2015  On Lisinopril 20. - last set of lipids: 05/03/2014: 154/193/26/90  No results found for: CHOL, HDL, LDLCALC, LDLDIRECT, TRIG, CHOLHDL On Simvastatin 20. - last eye exam was: Component Date Value  . HM  Diabetic Eye Exam 03/31/2015 Retinopathy* (mild)  He saw retina specialist at [facility].+ L cataract.  - no numbness and tingling in his feet.  He also has HTN, HL.  ROS: Constitutional: no weight gain/loss, no fatigue, no subjective hyperthermia/hypothermia Eyes: no blurry vision, no xerophthalmia ENT: no sore throat, no nodules palpated in throat, no dysphagia/odynophagia, no hoarseness Cardiovascular: no CP/SOB/palpitations/leg swelling Respiratory: no cough/SOB Gastrointestinal: no N/V/D/C Musculoskeletal: no muscle/joint aches Skin: no rashes Neurological: no tremors/numbness/tingling/dizziness  I reviewed pt's medications, allergies, PMH, social hx, family hx, and changes were documented in the history of present illness. Otherwise, unchanged from my initial visit note.  Past Medical History:  Diagnosis Date  . Chronic kidney disease    Stage III   . Diabetes mellitus without complication (HCC)   . History of seizures as a child   . Hyperlipidemia   . Hypertension    Past Surgical History:  Procedure Laterality Date  . HERNIA REPAIR    . KNEE ARTHROSCOPY    . TENDON REPAIR     r/acilles   History   Social History  . Marital Status: Married    Spouse Name: N/A  . Number of Children: 2   Occupational History  . Nurse [employer]   Social History Main Topics  . Smoking status:  Never Smoker   . Smokeless tobacco: No  . Alcohol Use: Yes, beer, liquor 2-3x a week - 3 drinks  . Drug Use: No   Current Outpatient Prescriptions on File Prior to Visit  Medication Sig Dispense Refill  . Beclomethasone Dipropionate (QNASL) 80 MCG/ACT AERS     . empagliflozin (JARDIANCE) 25 MG TABS tablet Take 25 mg by mouth daily. 30 tablet 2  . EPINEPHrine (EPIPEN) 0.3 mg/0.3 mL SOAJ injection Inject 0.3 mg into the muscle once.    . fenofibrate (TRICOR) 48 MG tablet Take 145 mg by mouth at bedtime.     . fish oil-omega-3 fatty acids 1000 MG capsule Take 1 g by mouth  every morning.    Marland Kitchen glipiZIDE (GLIPIZIDE XL) 5 MG 24 hr tablet Take 1 tablet (5 mg total) by mouth daily with breakfast. 90 tablet 1  . lisinopril-hydrochlorothiazide (PRINZIDE,ZESTORETIC) 20-12.5 MG per tablet Take 1 tablet by mouth at bedtime.    . metFORMIN (GLUCOPHAGE XR) 500 MG 24 hr tablet Take 2 tablets (1,000 mg total) by mouth 2 (two) times daily. 360 tablet 1  . Probiotic Product (PROBIOTIC DAILY) CAPS Take 1 capsule by mouth daily.    . simvastatin (ZOCOR) 20 MG tablet Take 20 mg by mouth at bedtime.     No current facility-administered medications on file prior to visit.    Allergies  Allergen Reactions  . Bee Venom Anaphylaxis  . Ivp Dye [Iodinated Diagnostic Agents] Hives  . Januvia [Sitagliptin] Other (See Comments)    Induces acid reflux, no appetite   . Fluorescein Rash   Family History  Problem Relation Age of Onset  . Diabetes Mother   . Hyperlipidemia Mother   . Hypertension Mother   . Diabetes Father   . Hyperlipidemia Father   . Hypertension Father   . Cancer Other   . Hypertension Maternal Grandmother   . Hyperlipidemia Maternal Grandmother   . Cancer Maternal Grandmother     Lung cancer  . Hyperlipidemia Maternal Grandfather   . Hypertension Maternal Grandfather   . CVA Maternal Grandfather   . Cancer Maternal Grandfather     Lung cancer  . Heart disease Paternal Grandmother     MI   . Stroke Paternal Grandmother   . Hypertension Paternal Grandmother   . Diabetes Paternal Grandfather   . Hyperlipidemia Paternal Grandfather    PE: BP 140/84   Pulse 71   Wt 229 lb 6.4 oz (104.1 kg)   SpO2 96%   BMI 32.92 kg/m  Body mass index is 32.92 kg/m. Wt Readings from Last 3 Encounters:  10/23/15 229 lb 6.4 oz (104.1 kg)  07/24/15 228 lb 9.6 oz (103.7 kg)  04/25/15 228 lb (103.4 kg)   Constitutional: overweight, in NAD Eyes: PERRLA, EOMI, no exophthalmos ENT: moist mucous membranes, no thyromegaly, no cervical lymphadenopathy Cardiovascular: RRR,  No MRG Respiratory: CTA B Gastrointestinal: abdomen soft, NT, ND, BS+ Musculoskeletal: no deformities, strength intact in all 4 Skin: moist, warm, no rashes Neurological: no tremor with outstretched hands, DTR normal in all 4  ASSESSMENT: 1. DM2, non-insulin-dependent, uncontrolled, with complications - CKD stage 3  - + mild DR  PLAN:  1. Patient with long-standing, uncontrolled diabetes, on oral antidiabetic regimen, with great CBGs >> sugars slightly higher than at last visit, but almost all at goal. Will not change regimen - reviewed last HbA1c (calculated from the fructosamine): much lower than the one measured, at 5.7%. - I suggested to:  Patient Instructions  Please continue: -  Metformin XR 1000 mg 2x a day, with meals - Jardiance 25 mg daily in am - Glipizide XL 5 mg in am  Please return in 3-4 months with your sugar log.   Please ask Dr Wynelle Link to send me your lab results from next visit.  Please stop at the lab.  - will try to obtain Lipid panel from PCP - will check a fructosamine today - continue checking sugars at different times of the day - check 1 time a day, rotating checks - Return to clinic in 3-4 mo with sugar log   Office Visit on 10/23/2015  Component Date Value Ref Range Status  . Fructosamine 10/24/2015 240  0 - 285 umol/L Final   Comment: Published reference interval for apparently healthy subjects between age 48 and 5 is 97 - 285 umol/L and in a poorly controlled diabetic population is 228 - 563 umol/L with a mean of 396 umol/L.    HbA1c calculated from the fructosamine is as good as before: 5.7%!  Carlus Pavlov, MD PhD [facility] Endocrinology

## 2015-10-24 ENCOUNTER — Encounter: Payer: Self-pay | Admitting: Internal Medicine

## 2015-10-24 LAB — FRUCTOSAMINE: FRUCTOSAMINE: 240 umol/L (ref 0–285)

## 2015-11-10 DIAGNOSIS — E785 Hyperlipidemia, unspecified: Secondary | ICD-10-CM | POA: Diagnosis not present

## 2015-11-10 DIAGNOSIS — I10 Essential (primary) hypertension: Secondary | ICD-10-CM | POA: Diagnosis not present

## 2015-11-13 DIAGNOSIS — E785 Hyperlipidemia, unspecified: Secondary | ICD-10-CM | POA: Diagnosis not present

## 2015-11-13 DIAGNOSIS — I10 Essential (primary) hypertension: Secondary | ICD-10-CM | POA: Diagnosis not present

## 2015-11-13 LAB — BASIC METABOLIC PANEL
BUN: 31 mg/dL — AB (ref 4–21)
CREATININE: 1.4 mg/dL — AB (ref <=1.3)
Glucose: 151 mg/dL
POTASSIUM: 4.7 mmol/L (ref 3.4–5.3)
Sodium: 139 mmol/L (ref 137–147)

## 2015-11-13 LAB — CBC AND DIFFERENTIAL
HCT: 42 % (ref 41–53)
Hemoglobin: 13.5 g/dL (ref 13.5–17.5)
Platelets: 301 10*3/uL (ref 150–399)
WBC: 5.3 10^3/mL

## 2015-11-13 LAB — HEPATIC FUNCTION PANEL
ALT: 11 U/L (ref 10–40)
AST: 11 U/L — AB (ref 14–40)

## 2015-11-13 LAB — LIPID PANEL
CHOLESTEROL: 183 mg/dL (ref 0–200)
HDL: 5 mg/dL — AB (ref 35–70)
TRIGLYCERIDES: 253 mg/dL — AB (ref 40–160)

## 2015-11-13 LAB — TSH: TSH: 1.76 u[IU]/mL (ref <=5.90)

## 2015-11-13 MED FILL — SIMVASTATIN 20 MG TABLET: 20 | 90 days supply | Qty: 90 | Fill #1

## 2015-11-13 MED FILL — FENOFIBRATE 145 MG TABLET: 145 | 90 days supply | Qty: 90 | Fill #0

## 2015-11-21 MED FILL — JARDIANCE 25 MG TABLET: 25 | 30 days supply | Qty: 30 | Fill #1

## 2015-12-29 MED FILL — JARDIANCE 25 MG TABLET: 25 | 30 days supply | Qty: 30 | Fill #2

## 2016-01-22 MED FILL — LISINOPRIL-HCTZ 20-12.5 MG: 20-12.5 | 90 days supply | Qty: 180 | Fill #0

## 2016-01-29 MED FILL — JARDIANCE 25 MG TABLET: 25 | 30 days supply | Qty: 30 | Fill #3

## 2016-01-29 MED FILL — METFORMIN HCL ER 500 MG TAB: 500 | 90 days supply | Qty: 360 | Fill #1

## 2016-01-29 MED FILL — glipiZIDE XL 5 MG TB24: 5 | 90 days supply | Qty: 90 | Fill #1

## 2016-02-12 MED FILL — SIMVASTATIN 20 MG TABLET: 20 | 90 days supply | Qty: 90 | Fill #0

## 2016-02-12 MED FILL — FENOFIBRATE 145 MG TABLET: 145 | 90 days supply | Qty: 90 | Fill #1

## 2016-02-23 ENCOUNTER — Encounter: Payer: Self-pay | Admitting: Internal Medicine

## 2016-02-23 ENCOUNTER — Ambulatory Visit (INDEPENDENT_AMBULATORY_CARE_PROVIDER_SITE_OTHER): Payer: 59 | Admitting: Internal Medicine

## 2016-02-23 VITALS — BP 128/72 | HR 74 | Wt 231.0 lb

## 2016-02-23 DIAGNOSIS — E1122 Type 2 diabetes mellitus with diabetic chronic kidney disease: Secondary | ICD-10-CM

## 2016-02-23 DIAGNOSIS — N183 Chronic kidney disease, stage 3 (moderate): Secondary | ICD-10-CM

## 2016-02-23 NOTE — Patient Instructions (Signed)
Please continue: - Metformin XR 1000 mg 2x a day, with meals - Jardiance 25 mg daily in am - Glipizide XL 5 mg in am  Please return in 3-4 months with your sugar log.   Please stop at the lab.

## 2016-02-23 NOTE — Progress Notes (Addendum)
Patient ID: Jonathan BarkerWilliam B Dalton, male   DOB: 11/15/1965, 50 y.o.   MRN: [id number]  HPI: Jonathan Dalton is a 50 y.o.-year-old male, returning for f/u for DM2, dx in 1994, non-insulin-dependent, uncontrolled, with complications (CKD stage 3, mild DR). Last visit 4 mo ago.  Last hemoglobin A1c was: 10/23/2015: HbA1c calculated from fructosamine is as good as before: 5.7%! 07/24/2015: HbA1c calculated from fructosamine is much lower than the one measured, at 5.7%. Lab Results  Component Value Date   HGBA1C 7.2 07/24/2015   HGBA1C 7.1 04/25/2015   HGBA1C 7.2 (H) 01/22/2015   Pt is on a regimen of: - Metformin XR 1000 mg 2x a day, with meals - Glipizide XL 10 mg >> 5 mg in am - Jardiance 25 mg daily He tried Januvia 2013 >> GERD, lack of appetite.  Pt checks his sugars 1-2x a day: - am: 48-140, 150 >> 85-138, > 155 >> 95-120 >> 99-115, 149 >> 101-130, 155 >> 103-112, 150 (Thanksgiving) - 2h after b'fast: n/c >> 88-102 >> 80-99 - before lunch: 60-124 >> n/c >> 90-130, 148 >> 88-106, 120, 140 >> 65, 95-120 >> 90-120 - 2h after lunch: n/c >> 126, 210 >> n/c >> 100-108 >> 112 >> 99, 101 - before dinner: 90s-130 >> 41, 79 >> 51x1 >> 97-130, 141 >> 99-112 >> 101-120 >> 110-121 - 2h after dinner: n/c >> 130, 140-160 >> 112-130 - bedtime: n/c >> 112-138 - nighttime: n/c No lows. Lowest sugar was 741 >> 51 (exertion, skipped a meal) >> 88 >> 65x1 >> 80; + hypoglycemia awareness in the 80s. Highest sugar was 202 (sick) >> 138. Glucometer: ReliOn  Pt's meals are: - Breakfast: oatmeal + bacon or cold cereal + 2% milk - Lunch: sandwich or dinner leftovers - Dinner: meat + veggies + starch - Snacks: fruit x2 He exercises 3-4x a week.  - + CKD stage 3, last BUN/creatinine:  Lab Results  Component Value Date   BUN 31 (A) 11/13/2015   CREATININE 1.4 (A) 11/13/2015  On Lisinopril 20. - last set of lipids: 05/03/2014: 154/193/26/90  Lab Results  Component Value Date   CHOL 183 11/13/2015   HDL 5  (A) 11/13/2015   TRIG 253 (A) 11/13/2015   On Simvastatin 20. - last eye exam was: Component Date Value  . HM Diabetic Eye Exam 03/31/2015 Retinopathy* (mild)  He saw retina specialist at [facility].+ L cataract.  - no numbness and tingling in his feet.  He also has HTN, HL.  ROS: Constitutional: no weight gain/loss, no fatigue, no subjective hyperthermia/hypothermia Eyes: no blurry vision, no xerophthalmia ENT: no sore throat, no nodules palpated in throat, no dysphagia/odynophagia, no hoarseness Cardiovascular: no CP/SOB/palpitations/leg swelling Respiratory: no cough/SOB Gastrointestinal: no N/V/D/C Musculoskeletal: no muscle/joint aches Skin: no rashes Neurological: no tremors/numbness/tingling/dizziness  I reviewed pt's medications, allergies, PMH, social hx, family hx, and changes were documented in the history of present illness. Otherwise, unchanged from my initial visit note.  Past Medical History:  Diagnosis Date  . Chronic kidney disease    Stage III   . Diabetes mellitus without complication (HCC)   . History of seizures as a child   . Hyperlipidemia   . Hypertension    Past Surgical History:  Procedure Laterality Date  . HERNIA REPAIR    . KNEE ARTHROSCOPY    . TENDON REPAIR     r/acilles   History   Social History  . Marital Status: Married    Spouse Name: N/A  .  Number of Children: 2   Occupational History  . Nurse [employer]   Social History Main Topics  . Smoking status: Never Smoker   . Smokeless tobacco: No  . Alcohol Use: Yes, beer, liquor 2-3x a week - 3 drinks  . Drug Use: No   Current Outpatient Prescriptions on File Prior to Visit  Medication Sig Dispense Refill  . empagliflozin (JARDIANCE) 25 MG TABS tablet Take 25 mg by mouth daily. 30 tablet 11  . EPINEPHrine (EPIPEN) 0.3 mg/0.3 mL SOAJ injection Inject 0.3 mg into the muscle once.    . fenofibrate (TRICOR) 48 MG tablet Take 145 mg by mouth at bedtime.     . fish  oil-omega-3 fatty acids 1000 MG capsule Take 1 g by mouth every morning.    Marland Kitchen glipiZIDE (GLIPIZIDE XL) 5 MG 24 hr tablet Take 1 tablet (5 mg total) by mouth daily with breakfast. 90 tablet 1  . lisinopril-hydrochlorothiazide (PRINZIDE,ZESTORETIC) 20-12.5 MG per tablet Take 1 tablet by mouth at bedtime.    . metFORMIN (GLUCOPHAGE XR) 500 MG 24 hr tablet Take 2 tablets (1,000 mg total) by mouth 2 (two) times daily. 360 tablet 1  . Probiotic Product (PROBIOTIC DAILY) CAPS Take 1 capsule by mouth daily.    . simvastatin (ZOCOR) 20 MG tablet Take 20 mg by mouth at bedtime.    . Beclomethasone Dipropionate (QNASL) 80 MCG/ACT AERS      No current facility-administered medications on file prior to visit.    Allergies  Allergen Reactions  . Bee Venom Anaphylaxis  . Ivp Dye [Iodinated Diagnostic Agents] Hives  . Januvia [Sitagliptin] Other (See Comments)    Induces acid reflux, no appetite   . Fluorescein Rash   Family History  Problem Relation Age of Onset  . Diabetes Mother   . Hyperlipidemia Mother   . Hypertension Mother   . Diabetes Father   . Hyperlipidemia Father   . Hypertension Father   . Cancer Other   . Hypertension Maternal Grandmother   . Hyperlipidemia Maternal Grandmother   . Cancer Maternal Grandmother     Lung cancer  . Hyperlipidemia Maternal Grandfather   . Hypertension Maternal Grandfather   . CVA Maternal Grandfather   . Cancer Maternal Grandfather     Lung cancer  . Heart disease Paternal Grandmother     MI   . Stroke Paternal Grandmother   . Hypertension Paternal Grandmother   . Diabetes Paternal Grandfather   . Hyperlipidemia Paternal Grandfather    PE: BP 128/72   Pulse 74   Wt 231 lb (104.8 kg)   SpO2 98%   BMI 33.15 kg/m  Body mass index is 33.15 kg/m. Wt Readings from Last 3 Encounters:  02/23/16 231 lb (104.8 kg)  10/23/15 229 lb 6.4 oz (104.1 kg)  07/24/15 228 lb 9.6 oz (103.7 kg)   Constitutional: overweight, in NAD Eyes: PERRLA, EOMI, no  exophthalmos ENT: moist mucous membranes, no thyromegaly, no cervical lymphadenopathy Cardiovascular: RRR, No MRG Respiratory: CTA B Gastrointestinal: abdomen soft, NT, ND, BS+ Musculoskeletal: no deformities, strength intact in all 4 Skin: moist, warm, no rashes Neurological: no tremor with outstretched hands, DTR normal in all 4  ASSESSMENT: 1. DM2, non-insulin-dependent, uncontrolled, with complications - CKD stage 3  - + mild DR  PLAN:  1. Patient with long-standing, uncontrolled diabetes, on oral antidiabetic regimen, with great CBGs - almost all at goal. Will not change regimen - reviewed last HbA1c (calculated from the fructosamine): great, at 5.7%. -  I suggested to:  Patient Instructions  Please continue: - Metformin XR 1000 mg 2x a day, with meals - Jardiance 25 mg daily in am - Glipizide XL 5 mg in am  Please return in 3-4 months with your sugar log.   Please stop at the lab.  - will check a fructosamine today - continue checking sugars at different times of the day - check 1 time a day, rotating checks - Return to clinic in 3-4 mo with sugar log   Office Visit on 02/23/2016  Component Date Value Ref Range Status  . Fructosamine 02/25/2016 279* 190 - 270 umol/L Final   The HbA1c calculated from the fructosamine is slightly higher, 6.35%!  Carlus Pavlovristina Bowie Doiron, MD PhD [facility] Endocrin[facility]

## 2016-02-25 LAB — FRUCTOSAMINE: Fructosamine: 279 umol/L — ABNORMAL HIGH (ref 190–270)

## 2016-02-26 MED FILL — JARDIANCE 25 MG TABLET: 25 | 30 days supply | Qty: 30 | Fill #4

## 2016-03-30 MED FILL — JARDIANCE 25 MG TABLET: 25 | 30 days supply | Qty: 30 | Fill #5

## 2016-04-23 MED FILL — LISINOPRIL-HCTZ 20-12.5 MG: 20-12.5 | 90 days supply | Qty: 180 | Fill #1

## 2016-04-28 ENCOUNTER — Other Ambulatory Visit: Payer: Self-pay | Admitting: Internal Medicine

## 2016-04-28 MED FILL — JARDIANCE 25 MG TABLET: 25 | 30 days supply | Qty: 30 | Fill #6

## 2016-04-28 MED FILL — glipiZIDE XL 5 MG TB24: 5 | 90 days supply | Qty: 90 | Fill #0

## 2016-04-28 MED FILL — METFORMIN HCL ER 500 MG TAB: 500 | 90 days supply | Qty: 360 | Fill #1

## 2016-05-12 DIAGNOSIS — I10 Essential (primary) hypertension: Secondary | ICD-10-CM | POA: Diagnosis not present

## 2016-05-12 DIAGNOSIS — E785 Hyperlipidemia, unspecified: Secondary | ICD-10-CM | POA: Diagnosis not present

## 2016-05-12 DIAGNOSIS — N183 Chronic kidney disease, stage 3 (moderate): Secondary | ICD-10-CM | POA: Diagnosis not present

## 2016-05-12 DIAGNOSIS — Z7984 Long term (current) use of oral hypoglycemic drugs: Secondary | ICD-10-CM | POA: Diagnosis not present

## 2016-05-12 DIAGNOSIS — E1122 Type 2 diabetes mellitus with diabetic chronic kidney disease: Secondary | ICD-10-CM | POA: Diagnosis not present

## 2016-05-12 MED FILL — SIMVASTATIN 20 MG TABLET: 20 | 90 days supply | Qty: 90 | Fill #0

## 2016-05-12 MED FILL — FENOFIBRATE 145 MG TAB: 145 | 90 days supply | Qty: 90 | Fill #0

## 2016-05-28 MED FILL — JARDIANCE 25 MG TABLET: 25 | 30 days supply | Qty: 30 | Fill #7

## 2016-06-22 ENCOUNTER — Ambulatory Visit (INDEPENDENT_AMBULATORY_CARE_PROVIDER_SITE_OTHER): Payer: 59 | Admitting: Internal Medicine

## 2016-06-22 ENCOUNTER — Encounter: Payer: Self-pay | Admitting: Internal Medicine

## 2016-06-22 VITALS — BP 132/78 | HR 75 | Ht 70.0 in | Wt 232.0 lb

## 2016-06-22 DIAGNOSIS — E1122 Type 2 diabetes mellitus with diabetic chronic kidney disease: Secondary | ICD-10-CM | POA: Diagnosis not present

## 2016-06-22 DIAGNOSIS — N183 Chronic kidney disease, stage 3 (moderate): Secondary | ICD-10-CM | POA: Diagnosis not present

## 2016-06-22 MED ORDER — EMPAGLIFLOZIN 25 MG PO TABS: 25.0000 mg | ORAL_TABLET | Freq: Every day | ORAL | 11 refills | Status: DC

## 2016-06-22 MED ORDER — METFORMIN HCL ER 500 MG PO TB24: 1000.0000 mg | ORAL_TABLET | Freq: Two times a day (BID) | ORAL | 1 refills | Status: DC

## 2016-06-22 MED ORDER — GLIPIZIDE ER 5 MG PO TB24: ORAL_TABLET | ORAL | 1 refills | Status: DC

## 2016-06-22 MED FILL — JARDIANCE 25 MG TABLET: 25 | 30 days supply | Qty: 30 | Fill #0

## 2016-06-22 NOTE — Patient Instructions (Addendum)
Please continue: - Metformin XR 1000 mg 2x a day, with meals - Jardiance 25 mg daily in am - Glipizide XL 5 mg in am  If you are on a clear liquid diet the day before your colonoscopy >> only take Metformin XR 500 mg 2x a day. The day of the colonoscopy skip the am meds.  Please return in 4 months with your sugar log.   Please stop at the lab.

## 2016-06-22 NOTE — Progress Notes (Signed)
Patient ID: Jonathan Dalton, male   DOB: 09/30/1965, 51 y.o.   MRN: [id number]  HPI: Jonathan Dalton is a 51 y.o.-year-old male, returning for f/u for DM2, dx in 1994, non-insulin-dependent, uncontrolled, with complications (CKD stage 3, mild DR). Last visit 4 mo ago.  Last hemoglobin A1c was: 02/23/2016: HbA1c calculated from the fructosamine is slightly higher, 6.35%. 10/23/2015: HbA1c calculated from fructosamine is as good as before: 5.7%! 07/24/2015: HbA1c calculated from fructosamine is much lower than the one measured, at 5.7%. Lab Results  Component Value Date   HGBA1C 7.2 07/24/2015   HGBA1C 7.1 04/25/2015   HGBA1C 7.2 (H) 01/22/2015   Pt is on a regimen of: - Metformin XR 1000 mg 2x a day, with meals - Glipizide XL 10 mg >> 5 mg in am - Jardiance 25 mg daily He tried Januvia 2013 >> GERD, lack of appetite.  Pt checks his sugars 1-2x a day: - am: 99-115, 149 >> 101-130, 155 >> 103-112, 150 >> 112-120, 133 - 2h after b'fast: n/c >> 88-102 >> 80-99 >> 112-123 - before lunch:  88-106, 120, 140 >> 65, 95-120 >> 90-120 >> 108-123, 130 - 2h after lunch: n/c >> 100-108 >> 112 >> 99, 101 >> n/c - before dinner:  97-130, 141 >> 99-112 >> 101-120 >> 110-121 >> 103-121 - 2h after dinner: n/c >> 130, 140-160 >> 112-130 >> n/c - bedtime: n/c >> 112-138 >> 121-133 - nighttime: n/c No lows. Lowest sugar was 65x1 >> 80 >> 103; + hypoglycemia awareness in the 80s. Highest sugar was 202 (sick) >> 138 >> 200 x1 (URI).  Glucometer: ReliOn  Pt's meals are: - Breakfast: oatmeal + bacon or cold cereal + 2% milk - Lunch: sandwich or dinner leftovers - Dinner: meat + veggies + starch - Snacks: fruit x2 He exercises 3-4x a week.  - + CKD stage 3, last BUN/creatinine:  Lab Results  Component Value Date   BUN 31 (A) 11/13/2015   CREATININE 1.4 (A) 11/13/2015  On Lisinopril 20. - last set of lipids: 05/03/2014: 154/193/26/90  Lab Results  Component Value Date   CHOL 183 11/13/2015   HDL  5 (A) 11/13/2015   TRIG 253 (A) 11/13/2015   On Simvastatin 20. - last eye exam was: Component Date Value  . HM Diabetic Eye Exam 03/31/2015 Retinopathy* (mild)  He saw retina specialist at [facility] L cataract.  - no numbness and tingling in his feet.  He also has HTN, HL.  He works day shift.  ROS: Constitutional: no weight gain/loss, no fatigue, no subjective hyperthermia/hypothermia Eyes: no blurry vision, no xerophthalmia ENT: no sore throat, no nodules palpated in throat, no dysphagia/odynophagia, no hoarseness Cardiovascular: no CP/SOB/palpitations/leg swelling Respiratory: no cough/SOB Gastrointestinal: no N/V/D/C Musculoskeletal: no muscle/joint aches Skin: no rashes Neurological: no tremors/numbness/tingling/dizziness  I reviewed pt's medications, allergies, PMH, social hx, family hx, and changes were documented in the history of present illness. Otherwise, unchanged from my initial visit note.  Past Medical History:  Diagnosis Date  . Chronic kidney disease    Stage III   . Diabetes mellitus without complication (HCC)   . History of seizures as a child   . Hyperlipidemia   . Hypertension    Past Surgical History:  Procedure Laterality Date  . HERNIA REPAIR    . KNEE ARTHROSCOPY    . TENDON REPAIR     r/acilles   History   Social History  . Marital Status: Married    Spouse Name:  N/A  . Number of Children: 2   Occupational History  . Nurse [employer]   Social History Dalton Topics  . Smoking status: Never Smoker   . Smokeless tobacco: No  . Alcohol Use: Yes, beer, liquor 2-3x a week - 3 drinks  . Drug Use: No   Current Outpatient Prescriptions on File Prior to Visit  Medication Sig Dispense Refill  . empagliflozin (JARDIANCE) 25 MG TABS tablet Take 25 mg by mouth daily. 30 tablet 11  . EPINEPHrine (EPIPEN) 0.3 mg/0.3 mL SOAJ injection Inject 0.3 mg into the muscle once.    . fenofibrate (TRICOR) 48 MG tablet Take 145 mg by mouth at  bedtime.     . fish oil-omega-3 fatty acids 1000 MG capsule Take 1 g by mouth every morning.    Marland Kitchen. GLIPIZIDE XL 5 MG 24 hr tablet TAKE 1 TABLET BY MOUTH ONCE DAILY WITH BREAKFAST 90 tablet 1  . lisinopril-hydrochlorothiazide (PRINZIDE,ZESTORETIC) 20-12.5 MG per tablet Take 1 tablet by mouth at bedtime.    . metFORMIN (GLUCOPHAGE XR) 500 MG 24 hr tablet Take 2 tablets (1,000 mg total) by mouth 2 (two) times daily. 360 tablet 1  . Probiotic Product (PROBIOTIC DAILY) CAPS Take 1 capsule by mouth daily.    . simvastatin (ZOCOR) 20 MG tablet Take 20 mg by mouth at bedtime.    . Beclomethasone Dipropionate (QNASL) 80 MCG/ACT AERS      No current facility-administered medications on file prior to visit.    Allergies  Allergen Reactions  . Bee Venom Anaphylaxis  . Ivp Dye [Iodinated Diagnostic Agents] Hives  . Januvia [Sitagliptin] Other (See Comments)    Induces acid reflux, no appetite   . Fluorescein Rash   Family History  Problem Relation Age of Onset  . Diabetes Mother   . Hyperlipidemia Mother   . Hypertension Mother   . Diabetes Father   . Hyperlipidemia Father   . Hypertension Father   . Cancer Other   . Hypertension Maternal Grandmother   . Hyperlipidemia Maternal Grandmother   . Cancer Maternal Grandmother     Lung cancer  . Hyperlipidemia Maternal Grandfather   . Hypertension Maternal Grandfather   . CVA Maternal Grandfather   . Cancer Maternal Grandfather     Lung cancer  . Heart disease Paternal Grandmother     MI   . Stroke Paternal Grandmother   . Hypertension Paternal Grandmother   . Diabetes Paternal Grandfather   . Hyperlipidemia Paternal Grandfather    PE: BP 132/78 (BP Location: Left Arm, Patient Position: Sitting)   Pulse 75   Ht 5\' 10"  (1.778 m)   Wt 232 lb (105.2 kg)   SpO2 98%   BMI 33.29 kg/m  Body mass index is 33.29 kg/m. Wt Readings from Last 3 Encounters:  06/22/16 232 lb (105.2 kg)  02/23/16 231 lb (104.8 kg)  10/23/15 229 lb 6.4 oz (104.1  kg)   Constitutional: overweight, in NAD Eyes: PERRLA, EOMI, no exophthalmos ENT: moist mucous membranes, no thyromegaly, no cervical lymphadenopathy Cardiovascular: RRR, No MRG Respiratory: CTA B Gastrointestinal: abdomen soft, NT, ND, BS+ Musculoskeletal: no deformities, strength intact in all 4 Skin: moist, warm, no rashes Neurological: no tremor with outstretched hands, DTR normal in all 4  ASSESSMENT: 1. DM2, non-insulin-dependent, uncontrolled, with complications - CKD stage 3  - + mild DR  PLAN:  1. Patient with long-standing, uncontrolled diabetes, on oral antidiabetic regimen, with slightly higher CBGs compared to last visit, however, we still very  good control. He had higher sugars this winter during an episode of URI, now improved, but not quite as low as at last visit. Since sugars are still at goal, not change regimen - He has an upcoming colonoscopy and we discussed about changing his diabetes regimen to accommodate the cleansing diet - reviewed last HbA1c (calculated from the fructosamine): good, at 6.35%  - I suggested to:  Patient Instructions  Please continue: - Metformin XR 1000 mg 2x a day, with meals - Jardiance 25 mg daily in am - Glipizide XL 5 mg in am  If you are on a clear liquid diet the day before your colonoscopy >> only take Metformin XR 500 mg 2x a day. The day of the colonoscopy skip the am meds.  Please return in 4 months with your sugar log.   Please stop at the lab.  - will check a fructosamine today - continue checking sugars at different times of the day - check 1 time a day, rotating checks - Return to clinic in 4 mo with sugar log   Office Visit on 06/22/2016  Component Date Value Ref Range Status  . Fructosamine 06/22/2016 276* 190 - 270 umol/L Final    HbA1c calculated from the fructosamine is 6.3%.  Carlus Pavlov, MD PhD [facility] Endocrinology

## 2016-06-24 LAB — FRUCTOSAMINE: Fructosamine: 276 umol/L — ABNORMAL HIGH (ref 190–270)

## 2016-06-28 ENCOUNTER — Telehealth: Payer: Self-pay

## 2016-06-28 NOTE — Telephone Encounter (Signed)
-----   Message from Carlus Pavlov, MD sent at 06/24/2016  3:47 PM EDT ----- [name], can you please call pt: HbA1c calculated from the fructosamine is 6.3%.

## 2016-06-28 NOTE — Telephone Encounter (Signed)
Unable to reach patient regarding labs, patients voicemail is full, not able to leave message.

## 2016-07-19 MED FILL — LISINOPRIL-HCTZ 20-12.5 MG: 20-12.5 | 90 days supply | Qty: 180 | Fill #0

## 2016-07-19 MED FILL — METFORMIN HCL ER 500 MG TAB: 500 | 90 days supply | Qty: 360 | Fill #0

## 2016-07-19 MED FILL — JARDIANCE 25 MG TABLET: 25 | 30 days supply | Qty: 30 | Fill #1

## 2016-07-27 MED FILL — glipiZIDE XL 5 MG TB24: 5 | 90 days supply | Qty: 90 | Fill #1

## 2016-08-12 MED FILL — FENOFIBRATE 145 MG TAB: 145 | 90 days supply | Qty: 90 | Fill #1

## 2016-08-12 MED FILL — SIMVASTATIN 20 MG TABLET: 20 | 90 days supply | Qty: 90 | Fill #1

## 2016-08-26 ENCOUNTER — Other Ambulatory Visit: Payer: Self-pay | Admitting: Internal Medicine

## 2016-08-27 MED FILL — JARDIANCE 25 MG TABLET: 25 | 30 days supply | Qty: 30 | Fill #2

## 2016-10-01 MED FILL — JARDIANCE 25 MG TABLET: 25 | 30 days supply | Qty: 30 | Fill #3

## 2016-10-15 MED FILL — LISINOPRIL-HCTZ 20-12.5 MG: 20-12.5 | 90 days supply | Qty: 180 | Fill #1

## 2016-10-15 MED FILL — METFORMIN HCL ER 500 MG TAB: 500 | 90 days supply | Qty: 360 | Fill #1

## 2016-10-21 ENCOUNTER — Ambulatory Visit (INDEPENDENT_AMBULATORY_CARE_PROVIDER_SITE_OTHER): Payer: 59 | Admitting: Internal Medicine

## 2016-10-21 VITALS — BP 140/82 | HR 76 | Wt 229.0 lb

## 2016-10-21 DIAGNOSIS — E1122 Type 2 diabetes mellitus with diabetic chronic kidney disease: Secondary | ICD-10-CM | POA: Diagnosis not present

## 2016-10-21 DIAGNOSIS — N183 Chronic kidney disease, stage 3 (moderate): Secondary | ICD-10-CM | POA: Diagnosis not present

## 2016-10-21 MED ORDER — GLIPIZIDE ER 5 MG PO TB24: ORAL_TABLET | ORAL | 3 refills | Status: DC

## 2016-10-21 MED ORDER — METFORMIN HCL ER 500 MG PO TB24: 1000.0000 mg | ORAL_TABLET | Freq: Two times a day (BID) | ORAL | 3 refills | Status: DC

## 2016-10-21 MED FILL — glipiZIDE ER 5 MG TB24: 5 | 90 days supply | Qty: 90 | Fill #0

## 2016-10-21 NOTE — Patient Instructions (Addendum)
Please continue: - Metformin XR 1000 mg 2x a day, with meals - Jardiance 25 mg daily in am - Glipizide XL 5 mg in am  Please return in 4 months with your sugar log.   Please let me know about the Freestyle Libre CGM.  Please stop at the lab.

## 2016-10-21 NOTE — Progress Notes (Signed)
Patient ID: Jonathan BarkerWilliam B Dalton, male   DOB: 02/09/1966, 51 y.o.   MRN: [id number]  HPI: Jonathan Dalton is a 51 y.o.-year-old male, returning for f/u for DM2, dx in 1994, non-insulin-dependent, uncontrolled, with complications (CKD stage 3, mild DR). Last visit 4 mo ago.  He tells me his sugars are higher b/c taking an EMT course in the evening. He will finish in 1 month.  Last hemoglobin A1c was: 06/22/2016: HbA1c calculated from the fructosamine is 6.3%. 02/23/2016: HbA1c calculated from the fructosamine is slightly higher, 6.35%. 10/23/2015: HbA1c calculated from fructosamine is as good as before: 5.7%! 07/24/2015: HbA1c calculated from fructosamine is much lower than the one measured, at 5.7%. Lab Results  Component Value Date   HGBA1C 7.2 07/24/2015   HGBA1C 7.1 04/25/2015   HGBA1C 7.2 (H) 01/22/2015   Pt is on a regimen of: - Metformin XR 1000 mg 2x a day, with meals - Glipizide XL 10 mg >> 5 mg in am - Jardiance 25 mg daily He tried Januvia 2013 >> GERD, lack of appetite.  Pt checks his sugars 1-2x a day: - am: 99-115, 149 >> 101-130, 155 >> 103-112, 150 >> 112-120, 133 >> 112-150 - 2h after b'fast: n/c >> 88-102 >> 80-99 >> 112-123 >> 112-120 - before lunch:  88-106, 120, 140 >> 65, 95-120 >> 90-120 >> 108-123, 130 >>108-118, 120 - 2h after lunch: n/c >> 100-108 >> 112 >> 99, 101 >> n/c >> 112-120 - before dinner:  97-130, 141 >> 99-112 >> 101-120 >> 110-121 >> 103-121 >> 109-120 - 2h after dinner: n/c >> 130, 140-160 >> 112-130 >> n/c >> [id number] - bedtime: n/c >> 112-138 >> 121-133 >> see above - nighttime: n/c No lows. Lowest sugar was 65x1 >> 80 >> 103 >> 109; + hypoglycemia awareness in the 80s Highest sugar was 202 (sick) >> 138 >> 200 x1 (URI) >> 150 x2.  Glucometer: ReliOn  Pt's meals are: - Breakfast: oatmeal + bacon or cold cereal + 2% milk - Lunch: sandwich or dinner leftovers - Dinner: meat + veggies + starch - Snacks: fruit x2 He exercises 3-4x a week. Less  recently during his course.  - He has CKD stage 3, last BUN/creatinine:  Lab Results  Component Value Date   BUN 31 (A) 11/13/2015   CREATININE 1.4 (A) 11/13/2015  On lisinopril 20 mg daily - last set of lipids: Lab Results  Component Value Date   CHOL 183 11/13/2015   HDL 5 (A) 11/13/2015   TRIG 253 (A) 11/13/2015  05/03/2014: 154/193/26/90  On simvastatin 20 mg daily - Reviewed the report of the most recent eye exam: Component Date Value  . HM Diabetic Eye Exam 03/31/2015 Retinopathy* (mild)  He saw retina specialist at [facility].+ L cataract. Has a coming up appt in 11/2016 with Dr. Elmer Dalton. - He denies numbness and tingling in his feet.  He also has HTN, HL.  He works day shift.  ROS: Constitutional: no weight gain/no weight loss, no fatigue, no subjective hyperthermia, no subjective hypothermia Eyes: no blurry vision, no xerophthalmia ENT: no sore throat, no nodules palpated in throat, no dysphagia, no odynophagia, no hoarseness Cardiovascular: no CP/no SOB/no palpitations/no leg swelling Respiratory: no cough/no SOB/no wheezing Gastrointestinal: no N/no V/no D/no C/no acid reflux Musculoskeletal: no muscle aches/no joint aches Skin: no rashes, no hair loss Neurological: no tremors/no numbness/no tingling/no dizziness  I reviewed pt's medications, allergies, PMH, social hx, family hx, and changes were documented in the history of  present illness. Otherwise, unchanged from my initial visit note.  Past Medical History:  Diagnosis Date  . Chronic kidney disease    Stage III   . Diabetes mellitus without complication (HCC)   . History of seizures as a child   . Hyperlipidemia   . Hypertension    Past Surgical History:  Procedure Laterality Date  . HERNIA REPAIR    . KNEE ARTHROSCOPY    . TENDON REPAIR     r/acilles   History   Social History  . Marital Status: Married    Spouse Name: N/A  . Number of Children: 2   Occupational History  .  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 Smoking status: Never Smoker   . Smokeless tobacco: No  . Alcohol Use: Yes, beer, liquor 2-3x a week - 3 drinks  . Drug Use: No   Current Outpatient Prescriptions on File Prior to Visit  Medication Sig Dispense Refill  . empagliflozin (JARDIANCE) 25 MG TABS tablet Take 25 mg by mouth daily. 30 tablet 11  . EPINEPHrine (EPIPEN) 0.3 mg/0.3 mL SOAJ injection Inject 0.3 mg into the muscle once.    . fenofibrate (TRICOR) 48 MG tablet Take 145 mg by mouth at bedtime.     . fish oil-omega-3 fatty acids 1000 MG capsule Take 1 g by mouth every morning.    Marland Kitchen glipiZIDE (GLIPIZIDE XL) 5 MG 24 hr tablet TAKE 1 TABLET BY MOUTH ONCE DAILY WITH BREAKFAST 90 tablet 1  . lisinopril-hydrochlorothiazide (PRINZIDE,ZESTORETIC) 20-12.5 MG per tablet Take 1 tablet by mouth at bedtime.    . metFORMIN (GLUCOPHAGE XR) 500 MG 24 hr tablet Take 2 tablets (1,000 mg total) by mouth 2 (two) times daily. 360 tablet 1  . Probiotic Product (PROBIOTIC DAILY) CAPS Take 1 capsule by mouth daily.    . simvastatin (ZOCOR) 20 MG tablet Take 20 mg by mouth at bedtime.    . Beclomethasone Dipropionate (QNASL) 80 MCG/ACT AERS      No current facility-administered medications on file prior to visit.    Allergies  Allergen Reactions  . Bee Venom Anaphylaxis  . Ivp Dye [Iodinated Diagnostic Agents] Hives  . Januvia [Sitagliptin] Other (See Comments)    Induces acid reflux, no appetite   . Fluorescein Rash   Family History  Problem Relation Age of Onset  . Diabetes Mother   . Hyperlipidemia Mother   . Hypertension Mother   . Diabetes Father   . Hyperlipidemia Father   . Hypertension Father   . Cancer Other   . Hypertension Maternal Grandmother   . Hyperlipidemia Maternal Grandmother   . Cancer Maternal Grandmother        Lung cancer  . Hyperlipidemia Maternal Grandfather   . Hypertension Maternal Grandfather   . CVA Maternal Grandfather   . Cancer Maternal Grandfather         Lung cancer  . Heart disease Paternal Grandmother        MI   . Stroke Paternal Grandmother   . Hypertension Paternal Grandmother   . Diabetes Paternal Grandfather   . Hyperlipidemia Paternal Grandfather    PE: BP 140/82 (BP Location: Left Arm, Patient Position: Sitting)   Pulse 76   Wt 229 lb (103.9 kg)   SpO2 97%   BMI 32.86 kg/m  Body mass index is 32.86 kg/m. Wt Readings from Last 3 Encounters:  10/21/16 229 lb (103.9 kg)  06/22/16 232 lb (105.2 kg)  02/23/16 231 lb (104.8  kg)  Constitutional: overweight, in NAD Eyes: PERRLA, EOMI, no exophthalmos ENT: moist mucous membranes, no thyromegaly, no cervical lymphadenopathy Cardiovascular: RRR, No MRG Respiratory: CTA B Gastrointestinal: abdomen soft, NT, ND, BS+ Musculoskeletal: no deformities, strength intact in all 4 Skin: moist, warm, no rashes Neurological: no tremor with outstretched hands, DTR normal in all 4  ASSESSMENT: 1. DM2, non-insulin-dependent, more controlled lately, with complications - CKD stage 3 - + mild DR  PLAN:  1. Patient with long-standing, controlled diabetes, on oral antidiabetic regimen, with slightly higher sugars at this visit (in am) due to stress as he is taking an EMT course. He still does a wonderful job checking his sugars every day at different times of the day. Based on his log, sugars are indeed higher in the morning, but they are stable during the day. We can continue with the current regimen. He will finish his course in a month. - reviewed last HbA1c (calculated from the fructosamine): Great, at 6.3% - We discussed about the new CGM: FreeStyle libre she will inquire at his pharmacy to see if this is covered. Given brochure and explained how it works. - I suggested to:  Patient Instructions  Please continue: - Metformin XR 1000 mg 2x a day, with meals - Jardiance 25 mg daily in am - Glipizide XL 5 mg in am  Please return in 4 months with your sugar log.   Please stop at the  lab.  - We'll check a fructosamine today - continue checking sugars at different times of the day - check 1x a day, rotating checks - advised for yearly eye exams >> he needs a new one. This is scheduled. - Return to clinic in 4 mo with sugar log   2. CKD stage 3 2/2 DM2 - I reviewed his BUN and creatinine from 10/2015 and these appear stable -  He has a new appointment with PCP soon. I will try to get his new kidney function results. We may need to adjust the dose of metformin and Jardiance after the results are back.  Office Visit on 10/21/2016  Component Date Value Ref Range Status  . Fructosamine 10/21/2016 301* 190 - 270 umol/L Final   HbA1c calculated from the fructosamine is slightly higher, at 6.7%.  Carlus Pavlovristina Evolet Salminen, MD PhD [facility]

## 2016-10-25 ENCOUNTER — Encounter: Payer: Self-pay | Admitting: Internal Medicine

## 2016-10-25 ENCOUNTER — Telehealth: Payer: Self-pay

## 2016-10-25 LAB — FRUCTOSAMINE: Fructosamine: 301 umol/L — ABNORMAL HIGH (ref 190–270)

## 2016-10-25 NOTE — Telephone Encounter (Signed)
LVM, gave lab results. Gave call back number if any questions or concerns.  

## 2016-10-25 NOTE — Telephone Encounter (Signed)
-----   Message from Cristina Gherghe, MD sent at 10/25/2016  4:01 PM EDT ----- Jonathan Dalton, can you please call pt: HbA1c calculated from the fructosamine is slightly higher, at 6.7%. 

## 2016-10-25 NOTE — Telephone Encounter (Signed)
-----   Message from Jonathan Pavlovristina Gherghe, MD sent at 10/25/2016  4:01 PM EDT ----- Raynelle FanningJulie, can you please call pt: HbA1c calculated from the fructosamine is slightly higher, at 6.7%.

## 2016-11-02 MED FILL — JARDIANCE 25 MG TABLET: 25 | 30 days supply | Qty: 30 | Fill #4

## 2016-11-02 MED FILL — SIMVASTATIN 20 MG TABLET: 20 | 90 days supply | Qty: 90 | Fill #1

## 2016-11-09 ENCOUNTER — Telehealth: Payer: Self-pay | Admitting: Internal Medicine

## 2016-11-09 ENCOUNTER — Telehealth: Payer: Self-pay

## 2016-11-09 ENCOUNTER — Other Ambulatory Visit: Payer: Self-pay

## 2016-11-09 DIAGNOSIS — E785 Hyperlipidemia, unspecified: Secondary | ICD-10-CM | POA: Diagnosis not present

## 2016-11-09 DIAGNOSIS — I10 Essential (primary) hypertension: Secondary | ICD-10-CM | POA: Diagnosis not present

## 2016-11-09 DIAGNOSIS — Z91038 Other insect allergy status: Secondary | ICD-10-CM | POA: Diagnosis not present

## 2016-11-09 MED ORDER — FREESTYLE LIBRE READER DEVI: 1.0000 | Freq: Every day | 0 refills | Status: DC

## 2016-11-09 MED ORDER — FREESTYLE LIBRE SENSOR SYSTEM MISC: 2 refills | Status: DC

## 2016-11-09 MED FILL — EPINEPHRINE 0.3 MG AUTO-INJ: 0.3 | 30 days supply | Qty: 2 | Fill #0

## 2016-11-09 MED FILL — FENOFIBRATE 145 MG TAB: 145 | 90 days supply | Qty: 90 | Fill #0

## 2016-11-09 MED FILL — FREESTYLE LIBRE READER DEVI: 1 days supply | Qty: 1 | Fill #0

## 2016-11-09 NOTE — Telephone Encounter (Signed)
Called Lisa to advise if we were sending in the RX for the freestyle libre device. Gave call back number for her to call back to advise .  

## 2016-11-09 NOTE — Telephone Encounter (Signed)
Submitted

## 2016-11-09 NOTE — Telephone Encounter (Signed)
RX submitted to pharmacy as requested.

## 2016-11-09 NOTE — Telephone Encounter (Signed)
Called Misty StanleyLisa to advise if we were sending in the RX for the freestyle libre device. Gave call back number for her to call back to advise .

## 2016-11-09 NOTE — Telephone Encounter (Signed)
Patient checked with insurance and they do cover the freestyle meter. Please call Misty StanleyLisa at [phone] to advise.  [facility] [place] [address] [phone] (Phone) [phone] (Fax)   Patient is requesting a call if this can be done today.

## 2016-11-09 NOTE — Telephone Encounter (Signed)
Patient's wife calling back. She says they don't have any additional concerns after I told her the [place] script was being sent in.  Ty,  -LL

## 2016-11-10 MED FILL — FREESTYLE LIBRE SENSOR SYST: 30 days supply | Qty: 3 | Fill #0

## 2016-11-11 DIAGNOSIS — E119 Type 2 diabetes mellitus without complications: Secondary | ICD-10-CM | POA: Diagnosis not present

## 2016-11-11 DIAGNOSIS — H2513 Age-related nuclear cataract, bilateral: Secondary | ICD-10-CM | POA: Diagnosis not present

## 2016-11-11 DIAGNOSIS — H34832 Tributary (branch) retinal vein occlusion, left eye, with macular edema: Secondary | ICD-10-CM | POA: Diagnosis not present

## 2016-11-11 DIAGNOSIS — H35033 Hypertensive retinopathy, bilateral: Secondary | ICD-10-CM | POA: Diagnosis not present

## 2016-11-11 LAB — HM DIABETES EYE EXAM

## 2016-11-16 ENCOUNTER — Telehealth: Payer: Self-pay

## 2016-11-16 NOTE — Telephone Encounter (Signed)
Called patient and advised of note from Dr.Gehrghe regarding the labs from [facility]. Advised patient to decrease the metformin to 1000 mg with dinner only because of worsening kidney function. Advised patient to call back with any questions.

## 2016-11-18 ENCOUNTER — Encounter: Payer: Self-pay | Admitting: Internal Medicine

## 2016-11-18 NOTE — Progress Notes (Signed)
Received lipid panel performed by PCP on 11/12/2016: 119/296/25/106 PCP recommended either increasing simvastatin to 40 mg a day changed to Crestor 20 mg a day.

## 2016-12-01 MED FILL — JARDIANCE 25 MG TABLET: 25 | 30 days supply | Qty: 30 | Fill #5

## 2016-12-28 MED FILL — SIMVASTATIN 40 MG TABLET: 40 | 60 days supply | Qty: 60 | Fill #0

## 2016-12-28 MED FILL — JARDIANCE 25 MG TABLET: 25 | 30 days supply | Qty: 30 | Fill #6

## 2017-01-05 DIAGNOSIS — M8508 Fibrous dysplasia (monostotic), other site: Secondary | ICD-10-CM | POA: Diagnosis not present

## 2017-01-13 MED FILL — LISINOPRIL-HCTZ 20-12.5 MG: 20-12.5 | 90 days supply | Qty: 180 | Fill #0

## 2017-01-21 DIAGNOSIS — F4321 Adjustment disorder with depressed mood: Secondary | ICD-10-CM | POA: Diagnosis not present

## 2017-01-21 MED FILL — LORazepam 0.5 MG TABS: 0.5 | 5 days supply | Qty: 15 | Fill #0

## 2017-01-24 MED FILL — glipiZIDE ER 5 MG TB24: 5 | 90 days supply | Qty: 90 | Fill #1

## 2017-01-31 MED FILL — JARDIANCE 25 MG TABLET: 25 | 30 days supply | Qty: 30 | Fill #7

## 2017-02-02 DIAGNOSIS — F4321 Adjustment disorder with depressed mood: Secondary | ICD-10-CM | POA: Diagnosis not present

## 2017-02-11 MED FILL — FENOFIBRATE 145 MG TABLET: 145 | 90 days supply | Qty: 90 | Fill #1

## 2017-02-23 ENCOUNTER — Ambulatory Visit (INDEPENDENT_AMBULATORY_CARE_PROVIDER_SITE_OTHER): Payer: 59 | Admitting: Internal Medicine

## 2017-02-23 ENCOUNTER — Encounter: Payer: Self-pay | Admitting: Internal Medicine

## 2017-02-23 VITALS — BP 136/78 | HR 70 | Wt 227.4 lb

## 2017-02-23 DIAGNOSIS — E1122 Type 2 diabetes mellitus with diabetic chronic kidney disease: Secondary | ICD-10-CM

## 2017-02-23 DIAGNOSIS — N183 Chronic kidney disease, stage 3 unspecified: Secondary | ICD-10-CM | POA: Insufficient documentation

## 2017-02-23 NOTE — Patient Instructions (Signed)
Please continue: - Metformin XR 1000 mg 2x a day, with meals - Jardiance 25 mg daily in am - Glipizide XL 5 mg in am  Please return in 4 months with your sugar log.   Please stop at the lab.

## 2017-02-23 NOTE — Progress Notes (Signed)
Patient ID: Jonathan Dalton, male   DOB: Nov 08, 1965, 51 y.o.   MRN: [id number]  HPI: Jonathan Dalton is a 51 y.o.-year-old male, returning for f/u for DM2, dx in 1994, non-insulin-dependent, uncontrolled, with complications (CKD stage 3, mild DR). Last visit 4 mo ago.  His wife just past away at 40 after a craniotomy. He is very affected by this and tearful in the office today.  Last hemoglobin A1c was: 10/18/2016: HbA1c calculated from the fructosamine is slightly higher, at 6.7%. 06/22/2016: HbA1c calculated from the fructosamine is 6.3%. 02/23/2016: HbA1c calculated from the fructosamine is slightly higher, 6.35%. 10/23/2015: HbA1c calculated from fructosamine is as good as before: 5.7%! 07/24/2015: HbA1c calculated from fructosamine is much lower than the one measured, at 5.7%. Lab Results  Component Value Date   HGBA1C 7.2 07/24/2015   HGBA1C 7.1 04/25/2015   HGBA1C 7.2 (H) 01/22/2015   Pt is on a regimen of: - Metformin XR 1000 mg 2x a day, with meals - Glipizide XL 10 mg >> 5 mg in am - Jardiance 25 mg daily He tried Januvia 2013 >> GERD, lack of appetite.  Pt checks his sugars 1-2x a day: - am: 112-120, 133 >> 112-150 >> 100-120 - 2h after b'fast:112-123 >> 112-120 >> n/c - before lunch: 108-123, 130 >>108-118, 120 >> n/c - 2h after lunch:99, 101 >> n/c >> 112-120 >> n/c - before dinner:103-121 >> 109-120 >> 90-180 - 2h after dinner: 112-130 >> n/c >> 118-138 >> n/c - bedtime: n/c >> 112-138 >> 121-133 >> see above - nighttime: n/c Lowest sugar was 109 >> 55; + hypoglycemia awareness in the 80s. Highest sugar was 150 x2 >> 230.  Glucometer: ReliOn  Pt's meals are: - Breakfast: oatmeal + bacon or cold cereal + 2% milk - Lunch: sandwich or dinner leftovers - Dinner: meat + veggies + starch - Snacks: none  - + stage 3 CKD, last BUN/creatinine:  Lab Results  Component Value Date   BUN 31 (A) 11/13/2015   CREATININE 1.4 (A) 11/13/2015  On Lisinopril 20. - + HL; last  set of lipids: Lab Results  Component Value Date   CHOL 183 11/13/2015   HDL 5 (A) 11/13/2015   TRIG 253 (A) 11/13/2015  05/03/2014: 154/193/26/90  On  Simvastatin.  - recent eye exam: 11/11/2016: No DR He saw retina specialist at [facility] L cataract. Dr. Elmer Picker. -No numbness and tingling in his feet.  He also has HTN.  He works day shift.  ROS: Constitutional: no weight gain/no weight loss, no fatigue, no subjective hyperthermia, no subjective hypothermia Eyes: no blurry vision, no xerophthalmia ENT: no sore throat, no nodules palpated in throat, no dysphagia, no odynophagia, no hoarseness Cardiovascular: no CP/no SOB/no palpitations/no leg swelling Respiratory: no cough/no SOB/no wheezing Gastrointestinal: no N/no V/no D/no C/no acid reflux Musculoskeletal: no muscle aches/no joint aches Skin: no rashes, no hair loss Neurological: no tremors/no numbness/no tingling/no dizziness  I reviewed pt's medications, allergies, PMH, social hx, family hx, and changes were documented in the history of present illness. Otherwise, unchanged from my initial visit note.  Past Medical History:  Diagnosis Date  . Chronic kidney disease    Stage III   . Diabetes mellitus without complication (HCC)   . History of seizures as a child   . Hyperlipidemia   . Hypertension    Past Surgical History:  Procedure Laterality Date  . HERNIA REPAIR    . KNEE ARTHROSCOPY    . TENDON REPAIR  r/acilles   History   Social History  . Marital Status: Married    Spouse Name: N/A  . Number of Children: 2   Occupational History  . Nurse [employer]   Social History Main Topics  . Smoking status: Never Smoker   . Smokeless tobacco: No  . Alcohol Use: Yes, beer, liquor 2-3x a week - 3 drinks  . Drug Use: No   Current Outpatient Medications on File Prior to Visit  Medication Sig Dispense Refill  . Continuous Blood Gluc Receiver (FREESTYLE LIBRE READER) DEVI 1 Device by Does not  apply route daily. 1 Device 0  . Continuous Blood Gluc Sensor (FREESTYLE LIBRE SENSOR SYSTEM) MISC Use one sensor every 10 days. 4 each 2  . empagliflozin (JARDIANCE) 25 MG TABS tablet Take 25 mg by mouth daily. 30 tablet 11  . EPINEPHrine (EPIPEN) 0.3 mg/0.3 mL SOAJ injection Inject 0.3 mg into the muscle once.    . fenofibrate (TRICOR) 48 MG tablet Take 145 mg by mouth at bedtime.     . fish oil-omega-3 fatty acids 1000 MG capsule Take 1 g by mouth every morning.    Marland Kitchen. glipiZIDE (GLIPIZIDE XL) 5 MG 24 hr tablet TAKE 1 TABLET BY MOUTH ONCE DAILY WITH BREAKFAST 90 tablet 3  . lisinopril-hydrochlorothiazide (PRINZIDE,ZESTORETIC) 20-12.5 MG per tablet Take 1 tablet by mouth at bedtime.    Marland Kitchen. LORazepam (ATIVAN) 0.5 MG tablet   0  . metFORMIN (GLUCOPHAGE XR) 500 MG 24 hr tablet Take 2 tablets (1,000 mg total) by mouth 2 (two) times daily. 360 tablet 3  . Probiotic Product (PROBIOTIC DAILY) CAPS Take 1 capsule by mouth daily.    . simvastatin (ZOCOR) 20 MG tablet Take 20 mg by mouth at bedtime.    . simvastatin (ZOCOR) 40 MG tablet   1  . Beclomethasone Dipropionate (QNASL) 80 MCG/ACT AERS      No current facility-administered medications on file prior to visit.    Allergies  Allergen Reactions  . Bee Venom Anaphylaxis  . Ivp Dye [Iodinated Diagnostic Agents] Hives  . Januvia [Sitagliptin] Other (See Comments)    Induces acid reflux, no appetite   . Fluorescein Rash   Family History  Problem Relation Age of Onset  . Diabetes Mother   . Hyperlipidemia Mother   . Hypertension Mother   . Diabetes Father   . Hyperlipidemia Father   . Hypertension Father   . Cancer Other   . Hypertension Maternal Grandmother   . Hyperlipidemia Maternal Grandmother   . Cancer Maternal Grandmother        Lung cancer  . Hyperlipidemia Maternal Grandfather   . Hypertension Maternal Grandfather   . CVA Maternal Grandfather   . Cancer Maternal Grandfather        Lung cancer  . Heart disease Paternal  Grandmother        MI   . Stroke Paternal Grandmother   . Hypertension Paternal Grandmother   . Diabetes Paternal Grandfather   . Hyperlipidemia Paternal Grandfather    PE: BP 136/78   Pulse 70   Wt 227 lb 6.4 oz (103.1 kg)   SpO2 99%   BMI 32.63 kg/m  Body mass index is 32.63 kg/m. Wt Readings from Last 3 Encounters:  02/23/17 227 lb 6.4 oz (103.1 kg)  10/21/16 229 lb (103.9 kg)  06/22/16 232 lb (105.2 kg)   Constitutional: overweight, in NAD Eyes: PERRLA, EOMI, no exophthalmos ENT: moist mucous membranes, no thyromegaly, no cervical lymphadenopathy Cardiovascular: RRR,  No MRG Respiratory: CTA B Gastrointestinal: abdomen soft, NT, ND, BS+ Musculoskeletal: no deformities, strength intact in all 4 Skin: moist, warm, no rashes Neurological: no tremor with outstretched hands, DTR normal in all 4  ASSESSMENT: 1. DM2, non-insulin-dependent, more controlled lately, with complications - CKD stage 3 - + mild DR  PLAN:  1. 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  He was doing a great job checking his sugars every day and at different times of the day.  His sugars were higher in the morning and lower during the day. - at this visit, however, his stress level has increased significantly with the death of his wife last month. He started not eating >> sugars lower. Now eats 3 meals a day, no snacks. Sugars are at goal in am but fluctuate later in the day - will check a fructosamine level >> if lower >> may need to decrease Glipizide XL to 2.5 mg daily. - I suggested to:  Patient Instructions  Please continue: - Metformin XR 1000 mg 2x a day, with meals - Jardiance 25 mg daily in am - Glipizide XL 5 mg in am  Please return in 4 months with your sugar log.   Please stop at the lab.  - continue checking sugars at different times of the day -  check 1x a day, rotating checks - advised for yearly eye exams >> he is UTD - Return to clinic in 4 mo with sugar log    2. CKD stage 3 2/2 DM2 - I reviewed his BUN and creatinine from 10/2015 and these appear stable.  He had another BMP by PCP in 10/2016 >> will ask for records.  Office Visit on 02/23/2017  Component Date Value Ref Range Status  . Fructosamine 02/23/2017 263  190 - 270 umol/L Final   HbA1c calculated from the fructosamine is better, at 6.1%.  Carlus Pavlovristina Marquesha Robideau, MD PhD [facility]

## 2017-02-25 LAB — FRUCTOSAMINE: Fructosamine: 263 umol/L (ref 190–270)

## 2017-03-01 MED FILL — SIMVASTATIN 40 MG TABLET: 40 | 90 days supply | Qty: 90 | Fill #0

## 2017-03-01 MED FILL — JARDIANCE 25 MG TABLET: 25 | 30 days supply | Qty: 30 | Fill #8

## 2017-03-31 MED FILL — METFORMIN HCL ER 500 MG TAB: 500 | 90 days supply | Qty: 360 | Fill #0

## 2017-03-31 MED FILL — JARDIANCE 25 MG TABLET: 25 | 30 days supply | Qty: 30 | Fill #9

## 2017-04-19 MED FILL — IBUPROFEN 800 MG TAB: 800 | 5 days supply | Qty: 20 | Fill #0

## 2017-04-19 MED FILL — AMOXICILLIN 500 MG CAPSULE: 500 | 7 days supply | Qty: 28 | Fill #0

## 2017-04-19 MED FILL — LISINOPRIL-HCTZ 20-12.5 MG: 20-12.5 | 90 days supply | Qty: 180 | Fill #1

## 2017-04-25 MED FILL — glipiZIDE ER 5 MG TB24: 5 | 90 days supply | Qty: 90 | Fill #2

## 2017-05-02 MED FILL — JARDIANCE 25 MG TABLET: 25 | 30 days supply | Qty: 30 | Fill #10

## 2017-05-13 MED FILL — ROSUVASTATIN CALCIUM 40 MG: 40 | 30 days supply | Qty: 30 | Fill #0

## 2017-05-17 MED FILL — FENOFIBRATE 145 MG TABLET: 145 | 90 days supply | Qty: 90 | Fill #0

## 2017-06-01 MED FILL — JARDIANCE 25 MG TABLET: 25 | 30 days supply | Qty: 30 | Fill #11

## 2017-06-03 MED FILL — CLINDAMYCIN HCL 150 MG CAPS: 150 | 7 days supply | Qty: 56 | Fill #0

## 2017-06-15 MED FILL — ROSUVASTATIN CALCIUM 40 MG: 40 | 30 days supply | Qty: 30 | Fill #1

## 2017-06-23 ENCOUNTER — Encounter: Payer: Self-pay | Admitting: Internal Medicine

## 2017-06-23 ENCOUNTER — Ambulatory Visit: Payer: No Typology Code available for payment source | Admitting: Internal Medicine

## 2017-06-23 VITALS — BP 142/84 | HR 71 | Ht 70.0 in | Wt 229.8 lb

## 2017-06-23 DIAGNOSIS — E785 Hyperlipidemia, unspecified: Secondary | ICD-10-CM | POA: Insufficient documentation

## 2017-06-23 DIAGNOSIS — N183 Chronic kidney disease, stage 3 (moderate): Secondary | ICD-10-CM

## 2017-06-23 DIAGNOSIS — E1122 Type 2 diabetes mellitus with diabetic chronic kidney disease: Secondary | ICD-10-CM

## 2017-06-23 LAB — POCT GLYCOSYLATED HEMOGLOBIN (HGB A1C): HEMOGLOBIN A1C: 10.2

## 2017-06-23 MED ORDER — GLIPIZIDE ER 5 MG PO TB24: ORAL_TABLET | ORAL | 3 refills | Status: DC

## 2017-06-23 MED ORDER — EMPAGLIFLOZIN 25 MG PO TABS: 25.0000 mg | ORAL_TABLET | Freq: Every day | ORAL | 11 refills | Status: DC

## 2017-06-23 NOTE — Progress Notes (Signed)
Patient ID: Jonathan BarkerWilliam B Dalton, male   DOB: 05/27/1965, 52 y.o.   MRN: [id number]  HPI: Jonathan BarkerWilliam B Dalton is a 52 y.o.-year-old male, returning for f/u for DM2, dx in 1994, non-insulin-dependent, uncontrolled, with complications (CKD stage 3, mild DR). Last visit 4 months ago.  Last hemoglobin A1c was: 02/23/2017: HbA1c calculated from the fructosamine is better, at 6.1%. 10/18/2016: HbA1c calculated from the fructosamine is slightly higher, at 6.7%. 06/22/2016: HbA1c calculated from the fructosamine is 6.3%. 02/23/2016: HbA1c calculated from the fructosamine is slightly higher, 6.35%. 10/23/2015: HbA1c calculated from fructosamine is as good as before: 5.7%! 07/24/2015: HbA1c calculated from fructosamine is much lower than the one measured, at 5.7%. Lab Results  Component Value Date   HGBA1C 7.2 07/24/2015   HGBA1C 7.1 04/25/2015   HGBA1C 7.2 (H) 01/22/2015   Pt is on a regimen of: - Metformin XR 1000 mg 2x a day, with meals - Jardiance 25 mg daily in am - Glipizide XL 5 mg in am He tried Januvia 2013 >> GERD, lack of appetite.  Pt checks his sugars 1-2 times a day: - am: 112-120, 133 >> 112-150 >> 100-120 >> 102-116, 127 - 2h after b'fast:112-123 >> 112-120 >> n/c >> 114-124 - before lunch: 108-123, 130 >>108-118, 120 >> n/c >> 109-126 - 2h after lunch:99, 101 >> n/c >> 112-120 >> n/c >> 116-121 - before dinner:103-121 >> 109-120 >> 90-180 >> 104-118 - 2h after dinner: 112-130 >> n/c >> 118-138 >> n/c >> 118, 120 - bedtime: n/c >> 112-138 >> 121-133 >> see above >> 121-132 - nighttime: n/c Lowest sugar was 109 >> 55 >> 102; + hypoglycemia awareness in the 80s. Highest sugar was 150 x2 >> 230 >> 132.  He had the Freestyle Libre CGM >> not covered anymore.   Glucometer: ReliOn  Pt's meals are: - Breakfast: oatmeal + bacon or cold cereal + 2% milk - Lunch: sandwich or dinner leftovers - Dinner: meat + veggies + starch - Snacks: none  - +Stage III CKD, last BUN/creatinine:  Lab  Results  Component Value Date   BUN 31 (A) 11/13/2015   CREATININE 1.4 (A) 11/13/2015  On lisinopril 20. -+ HL; last set of lipids: 11/12/2016:119/296/25/106 - PCP recommended changing to Crestor.  Lab Results  Component Value Date   CHOL 183 11/13/2015   HDL 5 (A) 11/13/2015   TRIG 253 (A) 11/13/2015  05/03/2014: 154/193/26/90  On fenofibrate, fish oil, and Crestor 40. -Most recent eye exam: 10/2016: No DR He saw retina specialist at [facility] L cataract. Dr. Elmer PickerHecker. -No numbness and tingling in his feet.  He also has HTN.  He works dayshift, now 8h shifts.  At last visit, he was very stressed, as his wife had just past away at 8149 after a craniotomy.  ROS: Constitutional: no weight gain/no weight loss, no fatigue, no subjective hyperthermia, no subjective hypothermia Eyes: no blurry vision, no xerophthalmia ENT: no sore throat, no nodules palpated in throat, no dysphagia, no odynophagia, no hoarseness Cardiovascular: no CP/no SOB/no palpitations/no leg swelling Respiratory: no cough/no SOB/no wheezing Gastrointestinal: no N/no V/no D/no C/no acid reflux Musculoskeletal: no muscle aches/no joint aches Skin: no rashes, no hair loss Neurological: no tremors/no numbness/no tingling/no dizziness  I reviewed pt's medications, allergies, PMH, social hx, family hx, and changes were documented in the history of present illness. Otherwise, unchanged from my initial visit note.  Past Medical History:  Diagnosis Date  . Chronic kidney disease    Stage III   .  Diabetes mellitus without complication (HCC)   . History of seizures as a child   . Hyperlipidemia   . Hypertension    Past Surgical History:  Procedure Laterality Date  . HERNIA REPAIR    . KNEE ARTHROSCOPY    . TENDON REPAIR     r/acilles   History   Social History  . Marital Status: Married    Spouse Name: N/A  . Number of Children: 2   Occupational History  . Nurse [employer]   Social  History Main Topics  . Smoking status: Never Smoker   . Smokeless tobacco: No  . Alcohol Use: Yes, beer, liquor 2-3x a week - 3 drinks  . Drug Use: No   Current Outpatient Medications on File Prior to Visit  Medication Sig Dispense Refill  . Beclomethasone Dipropionate (QNASL) 80 MCG/ACT AERS     . Continuous Blood Gluc Receiver (FREESTYLE LIBRE READER) DEVI 1 Device by Does not apply route daily. 1 Device 0  . Continuous Blood Gluc Sensor (FREESTYLE LIBRE SENSOR SYSTEM) MISC Use one sensor every 10 days. 4 each 2  . empagliflozin (JARDIANCE) 25 MG TABS tablet Take 25 mg by mouth daily. 30 tablet 11  . EPINEPHrine (EPIPEN) 0.3 mg/0.3 mL SOAJ injection Inject 0.3 mg into the muscle once.    . fenofibrate (TRICOR) 48 MG tablet Take 145 mg by mouth at bedtime.     . fish oil-omega-3 fatty acids 1000 MG capsule Take 1 g by mouth every morning.    Marland Kitchen glipiZIDE (GLIPIZIDE XL) 5 MG 24 hr tablet TAKE 1 TABLET BY MOUTH ONCE DAILY WITH BREAKFAST 90 tablet 3  . lisinopril-hydrochlorothiazide (PRINZIDE,ZESTORETIC) 20-12.5 MG per tablet Take 1 tablet by mouth at bedtime.    Marland Kitchen LORazepam (ATIVAN) 0.5 MG tablet   0  . metFORMIN (GLUCOPHAGE XR) 500 MG 24 hr tablet Take 2 tablets (1,000 mg total) by mouth 2 (two) times daily. 360 tablet 3  . Probiotic Product (PROBIOTIC DAILY) CAPS Take 1 capsule by mouth daily.    . simvastatin (ZOCOR) 20 MG tablet Take 20 mg by mouth at bedtime.    . simvastatin (ZOCOR) 40 MG tablet   1   No current facility-administered medications on file prior to visit.    Allergies  Allergen Reactions  . Bee Venom Anaphylaxis  . Ivp Dye [Iodinated Diagnostic Agents] Hives  . Januvia [Sitagliptin] Other (See Comments)    Induces acid reflux, no appetite   . Fluorescein Rash   Family History  Problem Relation Age of Onset  . Diabetes Mother   . Hyperlipidemia Mother   . Hypertension Mother   . Diabetes Father   . Hyperlipidemia Father   . Hypertension Father   . Cancer  Other   . Hypertension Maternal Grandmother   . Hyperlipidemia Maternal Grandmother   . Cancer Maternal Grandmother        Lung cancer  . Hyperlipidemia Maternal Grandfather   . Hypertension Maternal Grandfather   . CVA Maternal Grandfather   . Cancer Maternal Grandfather        Lung cancer  . Heart disease Paternal Grandmother        MI   . Stroke Paternal Grandmother   . Hypertension Paternal Grandmother   . Diabetes Paternal Grandfather   . Hyperlipidemia Paternal Grandfather    PE: BP (!) 142/84   Pulse 71   Ht 5\' 10"  (1.778 m)   Wt 229 lb 12.8 oz (104.2 kg)   SpO2 99%  BMI 32.97 kg/m  Body mass index is 32.97 kg/m. Wt Readings from Last 3 Encounters:  06/23/17 229 lb 12.8 oz (104.2 kg)  02/23/17 227 lb 6.4 oz (103.1 kg)  10/21/16 229 lb (103.9 kg)   Constitutional: overweight, in NAD Eyes: PERRLA, EOMI, no exophthalmos ENT: moist mucous membranes, no thyromegaly, no cervical lymphadenopathy Cardiovascular: RRR, No MRG Respiratory: CTA B Gastrointestinal: abdomen soft, NT, ND, BS+ Musculoskeletal: no deformities, strength intact in all 4 Skin: moist, warm, no rashes Neurological: no tremor with outstretched hands, DTR normal in all 4  ASSESSMENT: 1. DM2, non-insulin-dependent, more controlled lately, with complications - CKD stage 3 - + mild DR  2. HL  PLAN:  1. Patient with long-standing, uncontrolled, type 2 diabetes, on oral antidiabetic regimen, with a lot of stress at last visit due to the passing of his wife.  At that time, sugars were at goal in the morning but were fluctuating later in the day.  He was not eating well so we did not change his regimen at that time. - At this visit, sugars are very stable and almost all of them at goal,   Reflecting very good control - However, an HbA1c today was higher than 10%, which I believe is completely erroneous-we will check a fructosamine level today - I suggested to:  Patient Instructions  Please continue: -  Metformin XR 1000 mg 2x a day, with meals - Jardiance 25 mg daily in am - Glipizide XL 5 mg in am  Please return in 4 months with your sugar log.   Please stop at the lab.  - continue checking sugars at different times of the day - check 1x a day, rotating checks - advised for yearly eye exams >> he is UTD - Return to clinic in 4 mo with sugar log    2. HL - Reviewed lipid panel from 10/2016: LDL higher than goal, triglycerides high, HDL low.  At that time, simvastatin was changed to Crestor.  He tells me that he had another lipid panel in 04/2017 and at that time Crestor was increased to 40 mg daily.  He is returning for another lipid panel this Friday.  We discussed to please ask PCP to also send me these results along with the most recent recent kidney function. - Continues on Crestor, fish oil, TriCor without side effects  Office Visit on 06/23/2017  Component Date Value Ref Range Status  . Hemoglobin A1C 06/23/2017 10.2   Final  . Fructosamine 06/23/2017 346* 190 - 270 umol/L Final   The HbA1c calculated from the fructosamine is higher than before, at 7.5% but still much better than the directly measured one.   Carlus Pavlov, MD PhD [facility] Endocrinology

## 2017-06-23 NOTE — Patient Instructions (Signed)
Please continue: - Metformin XR 1000 mg 2x a day, with meals - Jardiance 25 mg daily in am - Glipizide XL 5 mg in am  Please return in 4 months with your sugar log.   Please stop at the lab. 

## 2017-06-27 LAB — FRUCTOSAMINE: FRUCTOSAMINE: 346 umol/L — AB (ref 190–270)

## 2017-06-30 ENCOUNTER — Telehealth: Payer: Self-pay | Admitting: Internal Medicine

## 2017-06-30 MED FILL — JARDIANCE 25 MG TABLET: 25 | 30 days supply | Qty: 30 | Fill #0

## 2017-06-30 NOTE — Telephone Encounter (Signed)
Called pt. No answer °

## 2017-06-30 NOTE — Telephone Encounter (Signed)
Patient is returning a call from the office.   Thanks!

## 2017-06-30 NOTE — Telephone Encounter (Signed)
-----   Message from Carlus Pavlovristina Gherghe, MD sent Jonathan 06/27/2017  4:47 PM EDT ----- Tad Mooreasandra, can you please call pt: Jonathan Dalton, Jonathan 7.5%.

## 2017-07-01 NOTE — Telephone Encounter (Addendum)
Called pt  No answer  Sent letter

## 2017-07-01 NOTE — Telephone Encounter (Signed)
This encounter was created in error - please disregard.

## 2017-07-01 NOTE — Telephone Encounter (Signed)
-----   Message from Cristina Gherghe, MD sent at 06/27/2017  4:47 PM EDT ----- Casandra, can you please call pt: The HbA1c calculated from the fructosamine is higher than before, at 7.5%. 

## 2017-07-07 MED FILL — ROSUVASTATIN CALCIUM 40 MG: 40 | 90 days supply | Qty: 90 | Fill #0

## 2017-07-19 MED FILL — LISINOPRIL-HCTZ 20-12.5 MG: 20-12.5 | 90 days supply | Qty: 180 | Fill #0

## 2017-07-20 MED FILL — HYDROCODON-APAP 5-325: 5-325 | 3 days supply | Qty: 12 | Fill #0

## 2017-07-20 MED FILL — IBUPROFEN 800 MG TAB: 800 | 5 days supply | Qty: 20 | Fill #0

## 2017-07-20 MED FILL — AMOXICILLIN 500 MG CAPSULE: 500 | 7 days supply | Qty: 21 | Fill #0

## 2017-07-22 MED FILL — glipiZIDE ER 5 MG TB24: 5 | 90 days supply | Qty: 90 | Fill #3

## 2017-07-29 MED FILL — JARDIANCE 25 MG TABLET: 25 | 30 days supply | Qty: 30 | Fill #1

## 2017-08-11 MED FILL — FENOFIBRATE 145 MG TABLET: 145 | 90 days supply | Qty: 90 | Fill #1

## 2017-08-29 MED FILL — JARDIANCE 25 MG TABLET: 25 | 30 days supply | Qty: 30 | Fill #2

## 2017-09-28 MED FILL — METFORMIN HCL ER 500 MG TAB: 500 | 90 days supply | Qty: 360 | Fill #1

## 2017-09-28 MED FILL — JARDIANCE 25 MG TABLET: 25 | 30 days supply | Qty: 30 | Fill #3

## 2017-10-14 MED FILL — ROSUVASTATIN CALCIUM 40 MG: 40 | 90 days supply | Qty: 90 | Fill #1

## 2017-10-18 MED FILL — LISINOPRIL-HCTZ 20-12.5 TAB: 20-12.5 | 90 days supply | Qty: 180 | Fill #1

## 2017-10-21 ENCOUNTER — Other Ambulatory Visit: Payer: Self-pay | Admitting: Internal Medicine

## 2017-10-21 MED FILL — glipiZIDE ER 5 MG TB24: 5 | 90 days supply | Qty: 90 | Fill #0

## 2017-10-25 ENCOUNTER — Ambulatory Visit (INDEPENDENT_AMBULATORY_CARE_PROVIDER_SITE_OTHER): Payer: No Typology Code available for payment source | Admitting: Internal Medicine

## 2017-10-25 ENCOUNTER — Encounter: Payer: Self-pay | Admitting: Internal Medicine

## 2017-10-25 VITALS — BP 118/78 | HR 87 | Ht 70.0 in | Wt 228.4 lb

## 2017-10-25 DIAGNOSIS — E785 Hyperlipidemia, unspecified: Secondary | ICD-10-CM

## 2017-10-25 DIAGNOSIS — N183 Chronic kidney disease, stage 3 unspecified: Secondary | ICD-10-CM

## 2017-10-25 DIAGNOSIS — E1122 Type 2 diabetes mellitus with diabetic chronic kidney disease: Secondary | ICD-10-CM | POA: Diagnosis not present

## 2017-10-25 MED ORDER — GLIPIZIDE ER 5 MG PO TB24: ORAL_TABLET | ORAL | 3 refills | Status: DC

## 2017-10-25 NOTE — Progress Notes (Signed)
Patient ID: Jonathan Dalton, male   DOB: 1965/05/29, 52 y.o.   MRN: [id number]  HPI: Jonathan Dalton is a 52 y.o.-year-old male, returning for f/u for DM2, dx in 1994, non-insulin-dependent, uncontrolled, with complications (CKD stage 3, mild DR). Last visit 4 months ago.  Sugars started to increase after our last visit.  He restarted to go to the gym in 07/2017.  Sugars did improve some, but not quite to target.  Last hemoglobin A1c was: 06/23/2017: HbA1c calculated from the fructosamine is higher than before, at 7.5% 02/23/2017: HbA1c calculated from the fructosamine is better, at 6.1%. 10/18/2016: HbA1c calculated from the fructosamine is slightly higher, at 6.7%. 06/22/2016: HbA1c calculated from the fructosamine is 6.3%. 02/23/2016: HbA1c calculated from the fructosamine is slightly higher, 6.35%. 10/23/2015: HbA1c calculated from fructosamine is as good as before: 5.7%! 07/24/2015: HbA1c calculated from fructosamine is much lower than the one measured, at 5.7%. Lab Results  Component Value Date   HGBA1C 10.2 06/23/2017   HGBA1C 7.2 07/24/2015   HGBA1C 7.1 04/25/2015   Pt is on a regimen of: - Metformin XR 1000 mg 2x a day, with meals - Jardiance 25 mg daily in am - Glipizide XL 5 mg in am He tried Januvia 2013 >> GERD, lack of appetite.  Pt checks his sugars 1-2x a day: - am: 100-120 >> 102-116, 127 >> 115-150 - 2h after b'fast: n/c >> 114-124 >> 200s - before lunch:  n/c >> 109-126 >> 105-140 - 2h after lunch: n/c >> 116-121 >> 105-140 - before dinner: 90-180 >> 104-118 >> 115-120 (150-160 after snack) - 2h after dinner:n/c >> 118, 120 >> 120 if working out, OTW up to 227 - bedtime:  see above >> 121-132 >> see above - nighttime: n/c Lowest sugar was  55 >> 102 >> 105; he has hypoglycemia awareness in the 80s. Highest sugar was 132 >> 227 after meals.  He had the Freestyle Libre CGM >> not covered anymore.   Glucometer: ReliOn  Pt's meals are: - Breakfast: oatmeal +  bacon or cold cereal + 2% milk - Lunch: sandwich or dinner leftovers - Dinner: meat + veggies + starch - Snacks: none  - + Stage III CKD, last BUN/creatinine:  05/13/2017: 34/1.92, glucose 185 Lab Results  Component Value Date   BUN 31 (A) 11/13/2015   CREATININE 1.4 (A) 11/13/2015  On lisinopril 20. -+ HL; last set of lipids for review: 07/01/2017: 131/113/35/73 11/12/2016:119/296/25/106 - PCP recommended changing to Crestor.  Lab Results  Component Value Date   CHOL 183 11/13/2015   HDL 5 (A) 11/13/2015   TRIG 253 (A) 11/13/2015  05/03/2014: 154/193/26/90  On fenofibrate, fish oil, Crestor 40 - Most recent eye exam: 10/2016 >> No DR He saw [facility] specialist at [facility] leftcataract. Dr. Elmer Picker. - deniesnumbness and tingling in his feet.  He also has HTN.  He works dayshift, now 8h shifts.  At the end of last year his wife passed away at 11 after a craniotomy.  ROS: Constitutional: no weight gain/no weight loss, no fatigue, no subjective hyperthermia, no subjective hypothermia Eyes: no blurry vision, no xerophthalmia ENT: no sore throat, no nodules palpated in throat, no dysphagia, no odynophagia, no hoarseness Cardiovascular: no CP/no SOB/no palpitations/no leg swelling Respiratory: no cough/no SOB/no wheezing Gastrointestinal: no N/no V/no D/no C/no acid reflux Musculoskeletal: no muscle aches/no joint aches Skin: no rashes, no hair loss Neurological: no tremors/no numbness/no tingling/no dizziness  I reviewed pt's medications, allergies, PMH, social hx, family  hx, and changes were documented in the history of present illness. Otherwise, unchanged from my initial visit note.  Past Medical History:  Diagnosis Date  . Chronic kidney disease    Stage III   . Diabetes mellitus without complication (HCC)   . History of seizures as a child   . Hyperlipidemia   . Hypertension    Past Surgical History:  Procedure Laterality Date  . HERNIA REPAIR    .  KNEE ARTHROSCOPY    . TENDON REPAIR     r/acilles   History   Social History  . Marital Status: Married    Spouse Name: N/A  . Number of Children: 2   Occupational History  . Nurse [employer]   Social History Main Topics  . Smoking status: Never Smoker   . Smokeless tobacco: No  . Alcohol Use: Yes, beer, liquor 2-3x a week - 3 drinks  . Drug Use: No   Current Outpatient Medications on File Prior to Visit  Medication Sig Dispense Refill  . Beclomethasone Dipropionate (QNASL) 80 MCG/ACT AERS     . Continuous Blood Gluc Sensor (FREESTYLE LIBRE SENSOR SYSTEM) MISC Use one sensor every 10 days. 4 each 2  . empagliflozin (JARDIANCE) 25 MG TABS tablet Take 25 mg by mouth daily. 30 tablet 11  . EPINEPHrine (EPIPEN) 0.3 mg/0.3 mL SOAJ injection Inject 0.3 mg into the muscle once.    . fenofibrate (TRICOR) 48 MG tablet Take 145 mg by mouth at bedtime.     . fish oil-omega-3 fatty acids 1000 MG capsule Take 1 g by mouth every morning.    Marland Kitchen. glipiZIDE (GLIPIZIDE XL) 5 MG 24 hr tablet TAKE 1 TABLET BY MOUTH ONCE DAILY WITH BREAKFAST 90 tablet 3  . glipiZIDE (GLUCOTROL XL) 5 MG 24 hr tablet TAKE 1 TABLET BY MOUTH ONCE DAILY WITH BREAKFAST 90 tablet 3  . lisinopril-hydrochlorothiazide (PRINZIDE,ZESTORETIC) 20-12.5 MG per tablet Take 1 tablet by mouth at bedtime.    Marland Kitchen. LORazepam (ATIVAN) 0.5 MG tablet   0  . metFORMIN (GLUCOPHAGE XR) 500 MG 24 hr tablet Take 2 tablets (1,000 mg total) by mouth 2 (two) times daily. 360 tablet 3  . Probiotic Product (PROBIOTIC DAILY) CAPS Take 1 capsule by mouth daily.    . rosuvastatin (CRESTOR) 40 MG tablet Take 1 tablet by mouth daily.     No current facility-administered medications on file prior to visit.    Allergies  Allergen Reactions  . Bee Venom Anaphylaxis  . Ivp Dye [Iodinated Diagnostic Agents] Hives  . Januvia [Sitagliptin] Other (See Comments)    Induces acid reflux, no appetite   . Fluorescein Rash   Family History  Problem Relation Age of  Onset  . Diabetes Mother   . Hyperlipidemia Mother   . Hypertension Mother   . Diabetes Father   . Hyperlipidemia Father   . Hypertension Father   . Cancer Other   . Hypertension Maternal Grandmother   . Hyperlipidemia Maternal Grandmother   . Cancer Maternal Grandmother        Lung cancer  . Hyperlipidemia Maternal Grandfather   . Hypertension Maternal Grandfather   . CVA Maternal Grandfather   . Cancer Maternal Grandfather        Lung cancer  . Heart disease Paternal Grandmother        MI   . Stroke Paternal Grandmother   . Hypertension Paternal Grandmother   . Diabetes Paternal Grandfather   . Hyperlipidemia Paternal Grandfather  PE: There were no vitals taken for this visit. There is no height or weight on file to calculate BMI. Wt Readings from Last 3 Encounters:  06/23/17 229 lb 12.8 oz (104.2 kg)  02/23/17 227 lb 6.4 oz (103.1 kg)  10/21/16 229 lb (103.9 kg)   Constitutional: overweight, in NAD Eyes: PERRLA, EOMI, no exophthalmos ENT: moist mucous membranes, no thyromegaly, no cervical lymphadenopathy Cardiovascular: RRR, No MRG Respiratory: CTA B Gastrointestinal: abdomen soft, NT, ND, BS+ Musculoskeletal: no deformities, strength intact in all 4 Skin: moist, warm, no rashes Neurological: no tremor with outstretched hands, DTR normal in all 4   ASSESSMENT: 1. DM2, non-insulin-dependent, more controlled lately, with complications - CKD stage 3 - + mild DR  2. HL  3.CKD stage III   PLAN:  1. Patient with Long-standing, uncontrolled, type 2 diabetes, on oral antidiabetic regimen, with worse sugars at the end of last year after the passing of his wife.  At last visit, however, the sugars were stable and almost all of them at goal.  His HbA1c was higher than 10%, which appeared to be inaccurate, so we checked a fructosamine level and this returned better, at 7.5%.  We did not change his regimen at that time. - At this visit, sugars are higher, especially in  a.m. and after meals.,  However, he is working on his diet and also started to go to the gym 2 months ago.  Sugars are better, but not at goal yet.  We discussed about increasing glipizide XL to 10 mg in a.m.  As sugars improve, we can most likely decrease the glipizide back to 5 mg daily - I suggested to:  Patient Instructions  Please continue: - Metformin XR 1000 mg 2x a day, with meals - Jardiance 25 mg daily in am  Please increase: - Glipizide XL to 10 mg in am  Please come back for a follow-up appointment in 3 months.  - today will check a fructosamine - continue checking sugars at different times of the day - check 1x a day, rotating checks - advised for yearly eye exams >> he is UTD - Return to clinic in 4 mo with sugar log     2. HL - Reviewed latest lipid panel from 06/2017: LDL at goal, TG at goal, HDL low - Crestor increased to 40 mg in 04/2017  - Continues Crestor, fish oil, Tricor without side effects.  3. CKD stage III  - His kidney function is worse per review goes from PCP - may need to decrease Metformin dose if it continues to decrease - also, may need to reduce Jardiance dose at next visit - Has another check by PCP in 11/2017  Jonathan Pavlov, MD PhD [facility] Endocrinology

## 2017-10-25 NOTE — Patient Instructions (Addendum)
Please continue: - Metformin XR 1000 mg 2x a day, with meals - Jardiance 25 mg daily in am  Please increase: - Glipizide XL to 10 mg in am  Please come back for a follow-up appointment in 3 months.

## 2017-10-27 LAB — FRUCTOSAMINE: Fructosamine: 319 umol/L — ABNORMAL HIGH (ref 190–270)

## 2017-10-27 MED FILL — JARDIANCE 25 MG TABLET: 25 | 30 days supply | Qty: 30 | Fill #4

## 2017-11-14 ENCOUNTER — Encounter: Payer: Self-pay | Admitting: Internal Medicine

## 2017-11-14 LAB — HM DIABETES EYE EXAM

## 2017-11-14 MED FILL — FENOFIBRATE 145 MG TAB: 145 | 90 days supply | Qty: 90 | Fill #0

## 2017-11-29 MED FILL — JARDIANCE 25 MG TABLET: 25 | 30 days supply | Qty: 30 | Fill #5

## 2017-12-23 ENCOUNTER — Telehealth: Payer: Self-pay | Admitting: Internal Medicine

## 2017-12-23 MED ORDER — GLIPIZIDE ER 10 MG PO TB24: 10.0000 mg | ORAL_TABLET | Freq: Every day | ORAL | 1 refills | Status: DC

## 2017-12-23 NOTE — Telephone Encounter (Signed)
New RX sent

## 2017-12-23 NOTE — Telephone Encounter (Signed)
Pt calling and stated he is out of his Glipizide medication  because Dr Elvera Lennox increased it back in July 30th visit.  [facility]

## 2017-12-23 NOTE — Telephone Encounter (Signed)
Please see other open phone encounter

## 2017-12-23 NOTE — Telephone Encounter (Signed)
Pt returned your call, please call ph # [phone]. This number only.

## 2017-12-23 NOTE — Telephone Encounter (Signed)
Called both numbers listed, one has no VM set up and the other VM is full.

## 2017-12-23 NOTE — Telephone Encounter (Signed)
RX was sent to pharmacy.Patient notified.

## 2017-12-29 MED FILL — JARDIANCE 25 MG TABLET: 25 | 30 days supply | Qty: 30 | Fill #6

## 2018-01-10 ENCOUNTER — Other Ambulatory Visit: Payer: Self-pay | Admitting: Internal Medicine

## 2018-01-10 MED FILL — metFORMIN HCL ER 500 MG TB2: 500 | 90 days supply | Qty: 360 | Fill #0

## 2018-01-13 MED FILL — ROSUVASTATIN CALCIUM 40 MG: 40 | 90 days supply | Qty: 90 | Fill #0

## 2018-01-18 MED FILL — LISINOPRIL-HCTZ 20-12.5 TAB: 20-12.5 | 90 days supply | Qty: 180 | Fill #0

## 2018-01-27 MED FILL — JARDIANCE 25 MG TABLET: 25 | 30 days supply | Qty: 30 | Fill #7

## 2018-02-17 ENCOUNTER — Ambulatory Visit: Payer: No Typology Code available for payment source | Admitting: Internal Medicine

## 2018-02-17 ENCOUNTER — Encounter: Payer: Self-pay | Admitting: Internal Medicine

## 2018-02-17 VITALS — BP 120/68 | HR 65 | Ht 70.0 in | Wt 225.0 lb

## 2018-02-17 DIAGNOSIS — N183 Chronic kidney disease, stage 3 unspecified: Secondary | ICD-10-CM

## 2018-02-17 DIAGNOSIS — E1122 Type 2 diabetes mellitus with diabetic chronic kidney disease: Secondary | ICD-10-CM | POA: Diagnosis not present

## 2018-02-17 DIAGNOSIS — E785 Hyperlipidemia, unspecified: Secondary | ICD-10-CM | POA: Diagnosis not present

## 2018-02-17 LAB — POCT GLYCOSYLATED HEMOGLOBIN (HGB A1C): HEMOGLOBIN A1C: 7.1 % — AB (ref 4.0–5.6)

## 2018-02-17 NOTE — Patient Instructions (Signed)
Please continue: - Metformin XR 1000 mg 2x a day, with meals - Jardiance 25 mg daily in am - Glipizide XL 10 mg in am  Please come back for a follow-up appointment in 4 months.

## 2018-02-17 NOTE — Progress Notes (Signed)
Patient ID: Jonathan Dalton, male   DOB: Dec 17, 1965, 52 y.o.   MRN: [id number]  HPI: Jonathan Dalton is a 52 y.o.-year-old male, returning for f/u for DM2, dx in Aug 22, 1992, non-insulin-dependent, uncontrolled, with complications (CKD stage 3, mild DR). Last visit 4 months ago.  He restarted to go to the gym in 2017-08-22.  Sugar did improve afterwards.  He continues to run now and his sugars are better still.  He also started to lose weight.  He has no complaints today.  Last hemoglobin A1c was: 10/25/2017: HbA1c calculated from fructosamine was better, at 7.3%: 06/23/2017: HbA1c calculated from the fructosamine is higher than before, at 7.5% 02/23/2017: HbA1c calculated from the fructosamine is better, at 6.1%. 10/18/2016: HbA1c calculated from the fructosamine is slightly higher, at 6.7%. 06/22/2016: HbA1c calculated from the fructosamine is 6.3%. 02/23/2016: HbA1c calculated from the fructosamine is slightly higher, 6.35%. 10/23/2015: HbA1c calculated from fructosamine is as good as before: 5.7%! 07/24/2015: HbA1c calculated from fructosamine is much lower than the one measured, at 5.7%. Lab Results  Component Value Date   HGBA1C 10.2 06/23/2017   HGBA1C 7.2 07/24/2015   HGBA1C 7.1 04/25/2015   Pt is on a regimen of: - Metformin XR 1000 mg 2x a day, with meals - Jardiance 25 mg daily in am - Glipizide XL 5 >> 10 mg in am He tried Januvia 2011/08/23 >> GERD, lack of appetite.  Pt checks his sugars 1-2 times a day: - am: 100-120 >> 102-116, 127 >> 115-150 >> 110-130s - 2h after b'fast: n/c >> 114-124 >> 200s >> 120-148 - before lunch:  n/c >> 109-126 >> 105-140 >> 122-130 (snack: 140s) - 2h after lunch: n/c >> 116-121 >> 105-140 >> 130s - before dinner: 104-118 >> 115-120 (150-160 after snack) >> 120-130 - 2h after dinner:118, 120 >> 120, 227 >> 130-140 - bedtime:  see above >> 121-132 >> see above - nighttime: n/c Lowest sugar was  55 >> 102 >> 105 >> 85; he has hypoglycemia awareness in the  80s. Highest sugar was 132 >> 227 after meals >> 175, 200 (gastroenteritis).  He had the Freestyle Libre CGM >> not covered anymore.   Glucometer: ReliOn  Pt's meals are: - Breakfast: oatmeal + bacon or cold cereal + 2% milk - Lunch: sandwich or dinner leftovers - Dinner: meat + veggies + starch - Snacks: none  - + Stage III CKD, last BUN/creatinine:  05/13/2017: 34/1.92, glucose 185 Lab Results  Component Value Date   BUN 31 (A) 11/13/2015   CREATININE 1.4 (A) 11/13/2015  On lisinopril 20. -+ HL; last set of lipids for review: 07/01/2017: 131/113/35/73 11/12/2016:119/296/25/106 - PCP recommended changing to Crestor.  Lab Results  Component Value Date   CHOL 183 11/13/2015   HDL 5 (A) 11/13/2015   TRIG 253 (A) 11/13/2015  05/03/2014: 154/193/26/90  On fenofibrate, fish oil, Crestor 40. - Most recent eye exam: 10/2017: No DR.  He saw retina specialist at [facility]. Dr. Elmer Picker.  He also has left cataract. - no numbness and tingling in his feet.  He also has HTN.  At the end of 2016-08-22 his wife passed away at 1 after a craniotomy.  ROS: Constitutional: no weight gain/+ weight loss, no fatigue, no subjective hyperthermia, no subjective hypothermia Eyes: no blurry vision, no xerophthalmia ENT: no sore throat, no nodules palpated in neck, no dysphagia, no odynophagia, no hoarseness Cardiovascular: no CP/no SOB/no palpitations/no leg swelling Respiratory: no cough/no SOB/no wheezing Gastrointestinal: no N/no V/no D/no  C/no acid reflux Musculoskeletal: no muscle aches/no joint aches Skin: no rashes, no hair loss Neurological: no tremors/no numbness/no tingling/no dizziness  I reviewed pt's medications, allergies, PMH, social hx, family hx, and changes were documented in the history of present illness. Otherwise, unchanged from my initial visit note.  Past Medical History:  Diagnosis Date  . Chronic kidney disease    Stage III   . Diabetes mellitus without  complication (HCC)   . History of seizures as a child   . Hyperlipidemia   . Hypertension    Past Surgical History:  Procedure Laterality Date  . HERNIA REPAIR    . KNEE ARTHROSCOPY    . TENDON REPAIR     r/acilles   History   Social History  . Marital Status: Married    Spouse Name: N/A  . Number of Children: 2   Occupational History  . Nurse [employer]   Social History Main Topics  . Smoking status: Never Smoker   . Smokeless tobacco: No  . Alcohol Use: Yes, beer, liquor 2-3x a week - 3 drinks  . Drug Use: No   Current Outpatient Medications on File Prior to Visit  Medication Sig Dispense Refill  . Beclomethasone Dipropionate (QNASL) 80 MCG/ACT AERS     . Continuous Blood Gluc Sensor (FREESTYLE LIBRE SENSOR SYSTEM) MISC Use one sensor every 10 days. 4 each 2  . empagliflozin (JARDIANCE) 25 MG TABS tablet Take 25 mg by mouth daily. 30 tablet 11  . EPINEPHrine (EPIPEN) 0.3 mg/0.3 mL SOAJ injection Inject 0.3 mg into the muscle once.    . fenofibrate (TRICOR) 48 MG tablet Take 145 mg by mouth at bedtime.     . fish oil-omega-3 fatty acids 1000 MG capsule Take 1 g by mouth every morning.    Marland Kitchen. glipiZIDE (GLIPIZIDE XL) 10 MG 24 hr tablet Take 1 tablet (10 mg total) by mouth daily with breakfast. 90 tablet 1  . lisinopril-hydrochlorothiazide (PRINZIDE,ZESTORETIC) 20-12.5 MG per tablet Take 1 tablet by mouth at bedtime.    . metFORMIN (GLUCOPHAGE-XR) 500 MG 24 hr tablet TAKE 2 TABLETS (1,000 MG TOTAL) BY MOUTH 2 TIMES DAILY. 360 tablet 3  . Probiotic Product (PROBIOTIC DAILY) CAPS Take 1 capsule by mouth daily.    . rosuvastatin (CRESTOR) 40 MG tablet Take 1 tablet by mouth daily.     No current facility-administered medications on file prior to visit.    Allergies  Allergen Reactions  . Bee Venom Anaphylaxis  . Ivp Dye [Iodinated Diagnostic Agents] Hives  . Januvia [Sitagliptin] Other (See Comments)    Induces acid reflux, no appetite   . Fluorescein Rash   Family  History  Problem Relation Age of Onset  . Diabetes Mother   . Hyperlipidemia Mother   . Hypertension Mother   . Diabetes Father   . Hyperlipidemia Father   . Hypertension Father   . Cancer Other   . Hypertension Maternal Grandmother   . Hyperlipidemia Maternal Grandmother   . Cancer Maternal Grandmother        Lung cancer  . Hyperlipidemia Maternal Grandfather   . Hypertension Maternal Grandfather   . CVA Maternal Grandfather   . Cancer Maternal Grandfather        Lung cancer  . Heart disease Paternal Grandmother        MI   . Stroke Paternal Grandmother   . Hypertension Paternal Grandmother   . Diabetes Paternal Grandfather   . Hyperlipidemia Paternal Grandfather    PE:  BP 120/68   Pulse 65   Ht 5\' 10"  (1.778 m) Comment: measured  Wt 225 lb (102.1 kg)   SpO2 97%   BMI 32.28 kg/m  Body mass index is 32.28 kg/m. Wt Readings from Last 3 Encounters:  02/17/18 225 lb (102.1 kg)  10/25/17 228 lb 6.4 oz (103.6 kg)  06/23/17 229 lb 12.8 oz (104.2 kg)   Constitutional: overweight, in NAD Eyes: PERRLA, EOMI, no exophthalmos ENT: moist mucous membranes, no thyromegaly, no cervical lymphadenopathy Cardiovascular: RRR, No MRG Respiratory: CTA B Gastrointestinal: abdomen soft, NT, ND, BS+ Musculoskeletal: no deformities, strength intact in all 4 Skin: moist, warm, no rashes Neurological: no tremor with outstretched hands, DTR normal in all 4   ASSESSMENT: 1. DM2, non-insulin-dependent, more controlled lately, with complications - CKD stage 3 - + mild DR  2. HL  3. CKD stage III   PLAN:  1. Patient with long-standing, uncontrolled, type 2 diabetes, on oral antidiabetic regimen with improved sugars at last visit, but still slightly high.  At that time, he restarted to go to the gym.  He continues this now.  HbA1c obtained at last visit (calculated from fructosamine) was better, 7.3%.  At that time, I advised him to increase glipizide in the morning since sugars were  higher after meals.  I did advise him to back off the dose if sugars start to improve - At this visit, he continues to exercise consistently and noticed that his sugars continue to improve.  He is adjusting the timing of meals to improve his sugars.  They are mostly at goal with very few exceptions.  He had an episode of gastroenteritis during the summer during which his sugars were in the 200s.  Higher sugars since then 175. - We can continue his current regimen for now - I suggested to:  Patient Instructions  Please continue: - Metformin XR 1000 mg 2x a day, with meals - Jardiance 25 mg daily in am - Glipizide XL 10 mg in am  Please come back for a follow-up appointment in 4 months.  - today, HbA1c is 7.1% (lower!) - continue checking sugars at different times of the day - check 1x a day, rotating checks - advised for yearly eye exams >> he is UTD - UTD with flu shot - Return to clinic in 4 mo with sugar log      2. HL - Reviewed latest lipid panel from 06/2017: LDL at goal, HDL low - Continues Crestor 40 (increased 04/2017), fish oil, Tricor without side effects.  3. CKD stage III  - more recent GFR lower, but he had with his PCP recently.  We will have to obtain these records. - may need to renally adjust Metformin and Jardiance dose  Carlus Pavlov, MD PhD [facility] Endocrinology

## 2018-02-20 MED FILL — FENOFIBRATE 145 MG TAB: 145 | 90 days supply | Qty: 90 | Fill #0

## 2018-02-27 MED FILL — JARDIANCE 25 MG TABLET: 25 | 30 days supply | Qty: 30 | Fill #8

## 2018-03-23 MED FILL — glipiZIDE ER 10 MG TB24: 10 | 90 days supply | Qty: 90 | Fill #1

## 2018-03-30 MED FILL — JARDIANCE 25 MG TABLET: 25 | 30 days supply | Qty: 30 | Fill #9

## 2018-04-13 MED FILL — metFORMIN HCL ER 500 MG TB2: 500 | 90 days supply | Qty: 360 | Fill #1

## 2018-04-20 MED FILL — LISINOPRIL-HCTZ 20-12.5 MG: 20-12.5 | 90 days supply | Qty: 180 | Fill #1

## 2018-04-20 MED FILL — ROSUVASTATIN CALCIUM 40 MG: 40 | 90 days supply | Qty: 90 | Fill #1

## 2018-05-01 MED FILL — JARDIANCE 25 MG TABLET: 25 | 30 days supply | Qty: 30 | Fill #10

## 2018-05-24 MED FILL — FENOFIBRATE 145 MG TABLET: 145 | 90 days supply | Qty: 90 | Fill #1

## 2018-06-01 MED FILL — JARDIANCE 25 MG TABLET: 25 | 30 days supply | Qty: 30 | Fill #11

## 2018-06-23 ENCOUNTER — Ambulatory Visit (INDEPENDENT_AMBULATORY_CARE_PROVIDER_SITE_OTHER): Payer: No Typology Code available for payment source | Admitting: Internal Medicine

## 2018-06-23 ENCOUNTER — Encounter: Payer: Self-pay | Admitting: Internal Medicine

## 2018-06-23 ENCOUNTER — Ambulatory Visit: Payer: Self-pay | Admitting: Internal Medicine

## 2018-06-23 ENCOUNTER — Other Ambulatory Visit: Payer: Self-pay

## 2018-06-23 DIAGNOSIS — E1122 Type 2 diabetes mellitus with diabetic chronic kidney disease: Secondary | ICD-10-CM

## 2018-06-23 DIAGNOSIS — E785 Hyperlipidemia, unspecified: Secondary | ICD-10-CM

## 2018-06-23 DIAGNOSIS — N183 Chronic kidney disease, stage 3 (moderate): Secondary | ICD-10-CM

## 2018-06-23 MED ORDER — METFORMIN HCL ER 500 MG PO TB24: ORAL_TABLET | ORAL | 3 refills | Status: DC

## 2018-06-23 MED ORDER — EMPAGLIFLOZIN 25 MG PO TABS: 25.0000 mg | ORAL_TABLET | Freq: Every day | ORAL | 11 refills | Status: DC

## 2018-06-23 MED ORDER — GLIPIZIDE ER 5 MG PO TB24: 5.0000 mg | ORAL_TABLET | Freq: Every day | ORAL | 3 refills | Status: DC

## 2018-06-23 MED FILL — glipiZIDE ER 5 MG TB24: 5 | 90 days supply | Qty: 90 | Fill #0

## 2018-06-23 NOTE — Patient Instructions (Addendum)
Please decrease: - Metformin XR to 1000 mg with dinner - Glipizide XL to 5 mg in am  For now, please continue: - Jardiance 25 mg daily in am  Please come back for kidney labs in 2 to 3 weeks, whenever safe.  Please come back for a follow-up appointment in 3 months.

## 2018-06-23 NOTE — Progress Notes (Signed)
Patient ID: Jonathan Dalton, male   DOB: 09/11/1965, 53 y.o.   MRN: [id number]  Patient location: Work My location: Office  Referring Provider: Deatra James, MD  I connected with on 06/23/18 at  1:00 PM EDT by a video enabled telemedicine application and verified that I am speaking with the correct person.   I discussed the limitations of evaluation and management by telemedicine and the availability of in person appointments. The patient expressed understanding and agreed to proceed.   Details of the encounter are shown below.  HPI: Jonathan Dalton is a 53 y.o.-year-old male, presents for f/u for DM2, dx in 1994, non-insulin-dependent, uncontrolled, with complications (CKD stage 3, mild DR). Last visit 4 months ago.  She restarted to go to the gym in 07/2017.  Sugars improved afterwards and they were continuing to improve including at last visit.  He also started to lose weight.  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  Last hemoglobin A1c was: 10/25/2017: HbA1c calculated from fructosamine was better, at 7.3%: 06/23/2017: HbA1c calculated from the fructosamine is higher than before, at 7.5% 02/23/2017: HbA1c calculated from the fructosamine is better, at 6.1%. 10/18/2016: HbA1c calculated from the fructosamine is slightly higher, at 6.7%. 06/22/2016: HbA1c calculated from the fructosamine is 6.3%. 02/23/2016: HbA1c calculated from the fructosamine is slightly higher, 6.35%. 10/23/2015: HbA1c calculated from fructosamine is as good as before: 5.7%! 07/24/2015: HbA1c calculated from fructosamine is much lower than the one measured, at 5.7%. Lab Results  Component Value Date   HGBA1C 7.1 (A) 02/17/2018   HGBA1C 10.2 06/23/2017   HGBA1C 7.2 07/24/2015   Pt is on a regimen of: - Metformin XR 1000 mg 2x a day, with meals - Jardiance 25 mg daily in am - Glipizide XL 5 >> 10 mg in a.m. He tried Januvia 2013 >> GERD, lack of  appetite.  Pt checks his sugars 1-2 times a day: - am: 115-150 >> 110-130s >> 120-130, 180 - 2h after b'fast:  200s >> 120-148 >> n/c - before lunch: 122-130 (snack: 140s) >> 50, 60, 95-112 (occasionally he needs to eat a snack in the middle of the morning) - 2h after lunch:  105-140 >> 130s >> n/c - before dinner:115-120 (150-160) >> 120-130 >> 120-130 - 2h after dinner: 120, 227 >> 130-140 >> 130-140, 180 - bedtime:  see above >> 121-132 >> see above - nighttime: n/c Lowest sugar was  55 ... >> 85 >> 50-55, 60; he has hypoglycemia awareness in the 80s. Highest sugar was 200 (gastroenteritis) >> 180s.  He had the Freestyle Libre CGM >> not covered anymore.  Glucometer: ReliOn  Pt's meals are: - Breakfast: oatmeal + bacon or cold cereal + 2% milk - Lunch: sandwich or dinner leftovers - Dinner: meat + veggies + starch - Snacks: none  -+ Stage III CKD, last BUN/creatinine:  06/12/2018: 50/2.39, glucose 155 05/13/2017: 34/1.92, glucose 185 Lab Results  Component Value Date   BUN 31 (A) 11/13/2015   CREATININE 1.4 (A) 11/13/2015  On lisinopril 20. -+ HL; latest lipids: 06/12/2018: 169/243/34/87 07/01/2017: 131/113/35/73 11/12/2016:119/296/25/106 - PCP recommended changing to Crestor.  Lab Results  Component Value Date   CHOL 183 11/13/2015   HDL 5 (A) 11/13/2015   TRIG 253 (A) 11/13/2015  05/03/2014: 154/193/26/90  On Crestor 40, fenofibrate, fish oil. - Most recent eye exam: 10/2017: No DR.  He saw retina specialist at [facility]. Dr. Elmer Picker.  +  L cataract. - no numbness and tingling in his feet.  He also has HTN.  Latest TSH (12/12/2017): 1.88.  At the end of 2016/07/25 his wife passed away at 60 after a craniotomy.  ROS: Constitutional: no weight gain/no weight loss, no fatigue, no subjective hyperthermia, no subjective hypothermia Eyes: no blurry vision, no xerophthalmia ENT: no sore throat, no nodules palpated in neck, no dysphagia, no odynophagia, no  hoarseness Cardiovascular: no CP/no SOB/no palpitations/no leg swelling Respiratory: no cough/no SOB/no wheezing Gastrointestinal: no N/no V/no D/no C/no acid reflux Musculoskeletal: no muscle aches/no joint aches Skin: no rashes, no hair loss Neurological: no tremors/no numbness/no tingling/no dizziness  I reviewed pt's medications, allergies, PMH, social hx, family hx, and changes were documented in the history of present illness. Otherwise, unchanged from my initial visit note.  Past Medical History:  Diagnosis Date  . Chronic kidney disease    Stage III   . Diabetes mellitus without complication (HCC)   . History of seizures as a child   . Hyperlipidemia   . Hypertension    Past Surgical History:  Procedure Laterality Date  . HERNIA REPAIR    . KNEE ARTHROSCOPY    . TENDON REPAIR     r/acilles   History   Social History  . Marital Status: Married    Spouse Name: N/A  . Number of Children: 2   Occupational History  . Nurse [employer]   Social History Main Topics  . Smoking status: Never Smoker   . Smokeless tobacco: No  . Alcohol Use: Yes, beer, liquor 2-3x a week - 3 drinks  . Drug Use: No   Current Outpatient Medications on File Prior to Visit  Medication Sig Dispense Refill  . Beclomethasone Dipropionate (QNASL) 80 MCG/ACT AERS     . empagliflozin (JARDIANCE) 25 MG TABS tablet Take 25 mg by mouth daily. 30 tablet 11  . EPINEPHrine (EPIPEN) 0.3 mg/0.3 mL SOAJ injection Inject 0.3 mg into the muscle once.    . fenofibrate (TRICOR) 48 MG tablet Take 145 mg by mouth at bedtime.     . fish oil-omega-3 fatty acids 1000 MG capsule Take 1 g by mouth every morning.    Marland Kitchen glipiZIDE (GLIPIZIDE XL) 10 MG 24 hr tablet Take 1 tablet (10 mg total) by mouth daily with breakfast. 90 tablet 1  . lisinopril-hydrochlorothiazide (PRINZIDE,ZESTORETIC) 20-12.5 MG per tablet Take 1 tablet by mouth at bedtime.    . metFORMIN (GLUCOPHAGE-XR) 500 MG 24 hr tablet TAKE 2 TABLETS (1,000 MG  TOTAL) BY MOUTH 2 TIMES DAILY. 360 tablet 3  . Probiotic Product (PROBIOTIC DAILY) CAPS Take 1 capsule by mouth daily.    . rosuvastatin (CRESTOR) 40 MG tablet Take 1 tablet by mouth daily.     No current facility-administered medications on file prior to visit.    Allergies  Allergen Reactions  . Bee Venom Anaphylaxis  . Ivp Dye [Iodinated Diagnostic Agents] Hives  . Januvia [Sitagliptin] Other (See Comments)    Induces acid reflux, no appetite   . Fluorescein Rash   Family History  Problem Relation Age of Onset  . Diabetes Mother   . Hyperlipidemia Mother   . Hypertension Mother   . Diabetes Father   . Hyperlipidemia Father   . Hypertension Father   . Cancer Other   . Hypertension Maternal Grandmother   . Hyperlipidemia Maternal Grandmother   . Cancer Maternal Grandmother        Lung cancer  . Hyperlipidemia Maternal Grandfather   .  Hypertension Maternal Grandfather   . CVA Maternal Grandfather   . Cancer Maternal Grandfather        Lung cancer  . Heart disease Paternal Grandmother        MI   . Stroke Paternal Grandmother   . Hypertension Paternal Grandmother   . Diabetes Paternal Grandfather   . Hyperlipidemia Paternal Grandfather    PE: There were no vitals taken for this visit. There is no height or weight on file to calculate BMI. Wt Readings from Last 3 Encounters:  02/17/18 225 lb (102.1 kg)  10/25/17 228 lb 6.4 oz (103.6 kg)  06/23/17 229 lb 12.8 oz (104.2 kg)   Constitutional:  in NAD  The physical exam was not performed (virtual visit).  ASSESSMENT: 1. DM2, non-insulin-dependent, more controlled lately, with complications - CKD stage 3 - + mild DR  2. HL  3.  Obesity  PLAN:  1. Patient with longstanding, uncontrolled, type 2 diabetes, on oral antidiabetic regimen with metformin, sulfonylurea, and SGLT 2 inhibitor.  At last visit, HbA1c was 7.1%, which was improved, so we did not check a fructosamine at that time.  We usually follow him with  fructosamine levels since these are more accurate for him. -At last visit, he was continuing to exercise consistently and sugars continued to improve.  He was also adjusting the timing with his meals to improve his sugars.  These were mostly at goal, with few exceptions.  We did not change the regimen then. -At this visit, I reviewed his most recent kidney function and this is worse compared to before.  Unfortunately, I do not have a GFR.  I discussed with the patient today that we will need to at least decrease the dose of metformin to half and for now, I suggested to continue the Jardiance but I would want him to return in 2 to 3 weeks, whenever he is safe to get another BMP with a GFR.  At that time, we may need to decrease or stop metformin and Jardiance.  If we need to do this, we can start a GLP-1 receptor agonist in the future.   -As he is having lows in the morning, would like to decrease the glipizide XL dose to 5 mg -I also advised him to discuss with PCP whether referral to nephrology is needed or whether he needs to change the dose of lisinopril. - I suggested to:  Patient Instructions  Please decrease: - Metformin XR to 1000 mg with dinner - Glipizide XL to 5 mg in am  For now, please continue: - Jardiance 25 mg daily in am  Please come back for kidney labs in 2 to 3 weeks, whenever safe.  Please come back for a follow-up appointment in 3 months.  - today we cannot check an HbA1c, we will check this at next visit - continue checking sugars at different times of the day - check 1x a day, rotating checks - advised for yearly eye exams >> he is UTD - Return to clinic in 3 mo with sugar log       2. HL - Reviewed latest lipid panel from 06/2017: LDL at goal, HDL low - Continues Crestor 40, fish oil, TriCor, without side effects.  3.  Obesity -We will continue Jardiance for now, we should also help with weight loss  Orders Placed This Encounter  Procedures  . BASIC METABOLIC  PANEL WITH GFR    Carlus Pavlov, MD PhD [facility] Endocrinology

## 2018-06-26 MED FILL — JARDIANCE 25 MG TABLET: 25 | 30 days supply | Qty: 30 | Fill #0

## 2018-07-14 MED FILL — ROSUVASTATIN CALCIUM 40 MG: 40 | 90 days supply | Qty: 90 | Fill #0

## 2018-07-18 MED FILL — LISINOPRIL-HCTZ 20-12.5 MG: 20-12.5 | 90 days supply | Qty: 180 | Fill #0

## 2018-07-27 MED FILL — JARDIANCE 25 MG TABLET: 25 | 30 days supply | Qty: 30 | Fill #1

## 2018-07-27 MED FILL — metFORMIN HCL ER 500 MG TB2: 500 | 90 days supply | Qty: 360 | Fill #2

## 2018-08-24 MED FILL — FENOFIBRATE 145 MG TABS: 145 | 90 days supply | Qty: 90 | Fill #0

## 2018-08-28 MED FILL — JARDIANCE 25 MG TABLET: 25 | 30 days supply | Qty: 30 | Fill #2

## 2018-09-20 MED FILL — glipiZIDE ER 5 MG TB24: 5 | 90 days supply | Qty: 90 | Fill #1

## 2018-09-27 MED FILL — JARDIANCE 25 MG TABLET: 25 | 30 days supply | Qty: 30 | Fill #3

## 2018-10-09 ENCOUNTER — Ambulatory Visit: Payer: Self-pay | Admitting: Internal Medicine

## 2018-10-18 MED FILL — ROSUVASTATIN CALCIUM 40 MG: 40 | 90 days supply | Qty: 90 | Fill #1

## 2018-10-18 MED FILL — LISINOPRIL-HCTZ 20-12.5 MG: 20-12.5 | 90 days supply | Qty: 180 | Fill #0

## 2018-10-30 MED FILL — JARDIANCE 25 MG TABLET: 25 | 30 days supply | Qty: 30 | Fill #4

## 2018-11-16 LAB — HM DIABETES EYE EXAM

## 2018-11-22 MED FILL — FENOFIBRATE 145 MG TAB: 145 | 90 days supply | Qty: 90 | Fill #0

## 2018-11-29 MED FILL — JARDIANCE 25 MG TABLET: 25 | 30 days supply | Qty: 30 | Fill #5

## 2018-12-18 MED FILL — EPINEPHRINE 0.3 MG AUTO-INJ: 0.3 | 30 days supply | Qty: 2 | Fill #0

## 2018-12-19 LAB — HEMOGLOBIN A1C: Hemoglobin A1C: 8.4

## 2018-12-20 ENCOUNTER — Encounter: Payer: Self-pay | Admitting: Internal Medicine

## 2018-12-20 MED FILL — glipiZIDE ER 5 MG TB24: 5 | 90 days supply | Qty: 90 | Fill #2

## 2018-12-21 ENCOUNTER — Encounter: Payer: Self-pay | Admitting: Internal Medicine

## 2018-12-21 ENCOUNTER — Ambulatory Visit: Payer: No Typology Code available for payment source | Admitting: Internal Medicine

## 2018-12-21 ENCOUNTER — Other Ambulatory Visit: Payer: Self-pay

## 2018-12-21 VITALS — BP 148/80 | HR 87 | Ht 70.0 in | Wt 231.0 lb

## 2018-12-21 DIAGNOSIS — E669 Obesity, unspecified: Secondary | ICD-10-CM | POA: Diagnosis not present

## 2018-12-21 DIAGNOSIS — N183 Chronic kidney disease, stage 3 (moderate): Secondary | ICD-10-CM

## 2018-12-21 DIAGNOSIS — E1122 Type 2 diabetes mellitus with diabetic chronic kidney disease: Secondary | ICD-10-CM | POA: Diagnosis not present

## 2018-12-21 DIAGNOSIS — E785 Hyperlipidemia, unspecified: Secondary | ICD-10-CM | POA: Diagnosis not present

## 2018-12-21 DIAGNOSIS — N1832 Chronic kidney disease, stage 3b: Secondary | ICD-10-CM

## 2018-12-21 MED ORDER — TRULICITY 0.75 MG/0.5ML ~~LOC~~ SOAJ: SUBCUTANEOUS | 1 refills | Status: DC

## 2018-12-21 MED FILL — TRULICITY 0.75 MG/0.5 ML PE: 0.75 | 28 days supply | Qty: 2 | Fill #0

## 2018-12-21 NOTE — Progress Notes (Addendum)
Patient ID: Jonathan Dalton Stuckert, male   DOB: 11/08/1965, 53 y.o.   MRN: [id number]  HPI: Jonathan Dalton Theall is a 53 y.o.-year-old male, presents for f/u for DM2, dx in 1994, non-insulin-dependent, uncontrolled, with complications (CKD stage 3, mild DR). Last visit 4 months ago- virtual.  At last visit he was telling me that he stopped going to the gym after the start of the coronavirus pandemic and his sugars are higher.  He is also [facility] [employer] [facility] now.  Last hemoglobin A1c was: 10/25/2017: HbA1c calculated from fructosamine was better, at 7.3%: 06/23/2017: HbA1c calculated from the fructosamine is higher than before, at 7.5% 02/23/2017: HbA1c calculated from the fructosamine is better, at 6.1%. 10/18/2016: HbA1c calculated from the fructosamine is slightly higher, at 6.7%. 06/22/2016: HbA1c calculated from the fructosamine is 6.3%. 02/23/2016: HbA1c calculated from the fructosamine is slightly higher, 6.35%. 10/23/2015: HbA1c calculated from fructosamine is as good as before: 5.7%! 07/24/2015: HbA1c calculated from fructosamine is much lower than the one measured, at 5.7%. Lab Results  Component Value Date   HGBA1C 8.4 12/19/2018   HGBA1C 7.1 (A) 02/17/2018   HGBA1C 10.2 06/23/2017   Pt is on a regimen of: - Metformin XR 1000 mg 2x a day, with meals >> 1000 mg with dinner - Jardiance 25 mg daily in am - Glipizide XL 5 >> 10 mg in a.m >> 5 mg in a.m. He tried Januvia 2013 >> GERD, lack of appetite.  Pt checks his sugars 1-2 times a day: - am: 115-150 >> 110-130s >> 120-130, 180 >> 130-150 - 2h after Dalton'fast:   120-148 >> n/c >> 140-150, 209, 238 - before lunch: 122-130 >> 50, 60, 95-112 >> 120-150 - 2h after lunch:  105-140 >> 130s >> n/c - before dinner: 120-130 >> 120-130 >> 120-150 - 2h after dinner:  130-140 >> 130-140, 180 >> 150 - bedtime:  see above >> 121-132 >> see above - nighttime: n/c Lowest sugar was  55 ... >> 85 >> 50-55, 60 >> 80; he has  hypoglycemia awareness in the 80s. Highest sugar was 200 (gastroenteritis) >> 180s >> 238  He had the Freestyle Libre CGM >> not covered anymore.  Glucometer: ReliOn  Pt's meals are: - Breakfast: oatmeal + bacon or cold cereal + 2% milk - Lunch: sandwich or dinner leftovers - Dinner: meat + veggies + starch - Snacks: none Few days a week: runs in am.  -+ Stage III CKD, last BUN/creatinine:  12/19/2018: 38/2.1, GFR 40, glucose 161 06/12/2018: 50/2.39, glucose 155 05/13/2017: 34/1.92, glucose 185 Lab Results  Component Value Date   BUN 31 (A) 11/13/2015   CREATININE 1.4 (A) 11/13/2015  On lisinopril 20. -+ HL; latest lipids: 12/19/2018: 146/172/37/74 06/12/2018: 169/243/34/87 07/01/2017: 131/113/35/73 11/12/2016:119/296/25/106 - PCP recommended changing to Crestor.  Lab Results  Component Value Date   CHOL 183 11/13/2015   HDL 5 (A) 11/13/2015   TRIG 253 (A) 11/13/2015  05/03/2014: 154/193/26/90  On Crestor 40, fenofibrate, fish oil - Most recent eye exam: 10/2017: No DR.  He saw retina specialist at [facility]. Dr. Elmer PickerHecker.  + L cataract. -Now numbness and tingling in his feet.  He also has HTN.  12/19/2018: TSH 2.06 TSH (12/12/2017): 1.88.  At the end of 2018 his wife passed away at 3149 after a craniotomy.  ROS: Constitutional: no weight gain/no weight loss, no fatigue, no subjective hyperthermia, no subjective hypothermia Eyes: no blurry vision, no xerophthalmia ENT: no sore throat, no nodules  palpated in neck, no dysphagia, no odynophagia, no hoarseness Cardiovascular: no CP/no SOB/no palpitations/no leg swelling Respiratory: no cough/no SOB/no wheezing Gastrointestinal: no N/no V/no D/no C/no acid reflux Musculoskeletal: no muscle aches/no joint aches Skin: no rashes, no hair loss Neurological: no tremors/no numbness/no tingling/no dizziness  I reviewed pt's medications, allergies, PMH, social hx, family hx, and changes were documented in the history of  present illness. Otherwise, unchanged from my initial visit note.  Past Medical History:  Diagnosis Date  . Chronic kidney disease    Stage III   . Diabetes mellitus without complication (HCC)   . History of seizures as a child   . Hyperlipidemia   . Hypertension    Past Surgical History:  Procedure Laterality Date  . HERNIA REPAIR    . KNEE ARTHROSCOPY    . TENDON REPAIR     r/acilles   History   Social History  . Marital Status: Married    Spouse Name: N/A  . Number of Children: 2   Occupational History  . Nurse [employer]   Social History Main Topics  . Smoking status: Never Smoker   . Smokeless tobacco: No  . Alcohol Use: Yes, beer, liquor 2-3x a week - 3 drinks  . Drug Use: No   Current Outpatient Medications on File Prior to Visit  Medication Sig Dispense Refill  . empagliflozin (JARDIANCE) 25 MG TABS tablet Take 25 mg by mouth daily. 30 tablet 11  . EPINEPHrine (EPIPEN) 0.3 mg/0.3 mL SOAJ injection Inject 0.3 mg into the muscle once.    . fenofibrate (TRICOR) 48 MG tablet Take 145 mg by mouth at bedtime.     . fish oil-omega-3 fatty acids 1000 MG capsule Take 1 g by mouth every morning.    Marland Kitchen glipiZIDE (GLUCOTROL XL) 5 MG 24 hr tablet Take 1 tablet (5 mg total) by mouth daily with breakfast. 90 tablet 3  . lisinopril-hydrochlorothiazide (PRINZIDE,ZESTORETIC) 20-12.5 MG per tablet Take 1 tablet by mouth at bedtime.    . metFORMIN (GLUCOPHAGE-XR) 500 MG 24 hr tablet TAKE 2 TABLETS (1,000 MG TOTAL) BY MOUTH with dinner. 360 tablet 3  . Probiotic Product (PROBIOTIC DAILY) CAPS Take 1 capsule by mouth daily.    . rosuvastatin (CRESTOR) 40 MG tablet Take 1 tablet by mouth daily.    Marland Kitchen SILDENAFIL CITRATE PO CHEW 1 TO 2 TABLETS BY MOUTH 30 TO 45 MINUTES BEFORE SEXUAL INTERCOURSE     No current facility-administered medications on file prior to visit.    Allergies  Allergen Reactions  . Bee Venom Anaphylaxis  . Ivp Dye [Iodinated Diagnostic Agents] Hives  . Januvia  [Sitagliptin] Other (See Comments)    Induces acid reflux, no appetite   . Fluorescein Rash   Family History  Problem Relation Age of Onset  . Diabetes Mother   . Hyperlipidemia Mother   . Hypertension Mother   . Diabetes Father   . Hyperlipidemia Father   . Hypertension Father   . Cancer Other   . Hypertension Maternal Grandmother   . Hyperlipidemia Maternal Grandmother   . Cancer Maternal Grandmother        Lung cancer  . Hyperlipidemia Maternal Grandfather   . Hypertension Maternal Grandfather   . CVA Maternal Grandfather   . Cancer Maternal Grandfather        Lung cancer  . Heart disease Paternal Grandmother        MI   . Stroke Paternal Grandmother   . Hypertension Paternal Grandmother   .  Diabetes Paternal Grandfather   . Hyperlipidemia Paternal Grandfather    PE: BP (!) 148/80   Pulse 87   Ht  (1.778 m)   Wt 231 lb (104.8 kg)   SpO2 98%   BMI 33.15 kg/m  Body mass index is 33.15 kg/m. Wt Readings from Last 3 Encounters:  12/21/18 231 lb (104.8 kg)  02/17/18 225 lb (102.1 kg)  10/25/17 228 lb 6.4 oz (103.6 kg)   Constitutional: overweight, in NAD Eyes: PERRLA, EOMI, no exophthalmos ENT: moist mucous membranes, no thyromegaly, no cervical lymphadenopathy Cardiovascular: RRR, No MRG Respiratory: CTA Dalton Gastrointestinal: abdomen soft, NT, ND, BS+ Musculoskeletal: no deformities, strength intact in all 4 Skin: moist, warm, no rashes Neurological: no tremor with outstretched hands, DTR normal in all 4  ASSESSMENT: 1. DM2, non-insulin-dependent, more controlled lately, with complications - CKD stage 3 - + mild DR  2. HL  3.  Obesity  PLAN:  1. Patient with longstanding uncontrolled, type 1 diabetes, on oral antidiabetic regimen with metformin, sulfonylurea, and SGLT 2 inhibitor.  Usually, we follow him with fructosamine levels since these are more accurate for him than the directly measured HbA1c levels. -At last visit, kidney function was worse so  we decreased his metformin dose.  We continued Jardiance.  We also decreased his glipizide at that time as he was having lows midday. -Since then, he had labs by PCP and kidney function is still poor.  His GFR is 40.  This allows [place] to use a low dose of metformin.  We can also continue Jardiance for now, which should also help with preventing further kidney damage. -Latest HbA1c from 2 days ago was higher, at 8.4% -At this visit, he reports higher blood sugars in the morning and lack of improvement with fasting or after going for a run.  He feels that her sugars are staying stable higher than normal throughout the day.  He does not have any more low blood sugars after decreasing glipizide -We discussed that we are limited in the medication choices by his kidney function but I suggested a GLP-1 receptor agonist which is not renally eliminated.  We discussed about benefits and possible side effects.  We will start at the low dose and advance as tolerated.  We will stop glipizide with starting the GLP-1 receptor agonist - I suggested to:  Patient Instructions  Please continue: - Metformin XR 1000 mg with dinner - Jardiance 25 mg daily in am  Please stop Glipizide XL.  Please start Trulicity 0.75 mg weekly. Let me know when you are close to running out to call in the higher dose to your pharmacy (1.5 mg).  Please come back for a follow-up appointment in 3 to 4 months.  - advised to check sugars at different times of the day - 1-2x a day, rotating check times - advised for yearly eye exams >> he is UTD - return to clinic in 3-4 months      2. HL - Reviewed latest lipid panel from 11/2018: LDL at goal, as are the rest of his lipid fractions -Continues Crestor, fish oil, TriCor, without side effectss.  3.  Obesity -Continue Jardiance which should also help with weight loss -At this visit, was not Trulicity and I advised him that this should also help with weight loss.  Stopping glipizide should  also help  Office Visit on 12/21/2018  Component Date Value Ref Range Status  . Fructosamine 12/21/2018 282  205 - 285 umol/L Final  12/21/2018: HbA1c calculated from fructosamine improved to 6.4%.  Philemon Kingdom, MD PhD [facility]

## 2018-12-21 NOTE — Patient Instructions (Addendum)
Please continue: - Metformin XR 1000 mg with dinner - Jardiance 25 mg daily in am  Please stop Glipizide XL.  Please start Trulicity 3.84 mg weekly. Let me know when you are close to running out to call in the higher dose to your pharmacy (1.5 mg).  Please come back for a follow-up appointment in 3 to 4 months.

## 2018-12-28 LAB — FRUCTOSAMINE: Fructosamine: 282 umol/L (ref 205–285)

## 2018-12-28 MED FILL — JARDIANCE 25 MG TABLET: 25 | 30 days supply | Qty: 30 | Fill #6

## 2019-01-12 ENCOUNTER — Encounter: Payer: Self-pay | Admitting: Internal Medicine

## 2019-01-12 ENCOUNTER — Other Ambulatory Visit: Payer: Self-pay | Admitting: Internal Medicine

## 2019-01-18 MED FILL — ROSUVASTATIN CALCIUM 40 MG: 40 | 90 days supply | Qty: 90 | Fill #0

## 2019-01-23 MED FILL — LISINOPRIL-HCTZ 20-12.5 MG: 20-12.5 | 90 days supply | Qty: 180 | Fill #0

## 2019-01-23 MED FILL — TRULICITY 0.75 MG/0.5 ML PE: 0.75 | 28 days supply | Qty: 2 | Fill #1

## 2019-01-29 MED FILL — JARDIANCE 25 MG TABLET: 25 | 30 days supply | Qty: 30 | Fill #7

## 2019-01-30 ENCOUNTER — Other Ambulatory Visit: Payer: Self-pay | Admitting: Internal Medicine

## 2019-01-30 MED FILL — metFORMIN HCL ER 500 MG TB2: 500 | 30 days supply | Qty: 120 | Fill #0

## 2019-02-19 ENCOUNTER — Other Ambulatory Visit: Payer: Self-pay

## 2019-02-19 MED ORDER — TRULICITY 0.75 MG/0.5ML ~~LOC~~ SOAJ: SUBCUTANEOUS | 4 refills | Status: DC

## 2019-02-19 MED FILL — TRULICITY 0.75 MG/0.5 ML PE: 0.75 | 28 days supply | Qty: 2 | Fill #0

## 2019-02-26 MED FILL — FENOFIBRATE 145 MG TABS: 145 | 90 days supply | Qty: 90 | Fill #0

## 2019-03-01 MED FILL — JARDIANCE 25 MG TABLET: 25 | 30 days supply | Qty: 30 | Fill #8

## 2019-03-15 ENCOUNTER — Encounter: Payer: Self-pay | Admitting: Internal Medicine

## 2019-03-15 ENCOUNTER — Other Ambulatory Visit: Payer: Self-pay

## 2019-03-15 ENCOUNTER — Ambulatory Visit: Payer: No Typology Code available for payment source | Admitting: Internal Medicine

## 2019-03-15 VITALS — BP 140/70 | HR 84 | Ht 70.0 in | Wt 224.0 lb

## 2019-03-15 DIAGNOSIS — E1121 Type 2 diabetes mellitus with diabetic nephropathy: Secondary | ICD-10-CM | POA: Diagnosis not present

## 2019-03-15 DIAGNOSIS — E669 Obesity, unspecified: Secondary | ICD-10-CM | POA: Diagnosis not present

## 2019-03-15 DIAGNOSIS — N1832 Chronic kidney disease, stage 3b: Secondary | ICD-10-CM | POA: Diagnosis not present

## 2019-03-15 DIAGNOSIS — E785 Hyperlipidemia, unspecified: Secondary | ICD-10-CM | POA: Diagnosis not present

## 2019-03-15 LAB — POCT GLYCOSYLATED HEMOGLOBIN (HGB A1C): Hemoglobin A1C: 8.5 % — AB (ref 4.0–5.6)

## 2019-03-15 NOTE — Progress Notes (Signed)
Patient ID: Jonathan Dalton, male   DOB: [birth year], 53 y.o.   MRN: [id number]  This visit occurred during the SARS-CoV-2 public health emergency.  Safety protocols were in place, including screening questions prior to the visit, additional usage of staff PPE, and extensive cleaning of exam room while observing appropriate contact time as indicated for disinfecting solutions.   HPI: Jonathan Dalton is a 53 y.o.-year-old male, presents for f/u for DM2, dx in 1994, non-insulin-dependent, uncontrolled, with complications (CKD stage 3, mild DR). Last visit 3 months ago.  We started Trulicity at last visit and she initially had nausea with it, but this resolved in approximately a month after he started.  Reviewed HbA1c levels: 12/21/2018: HbA1c calculated from fructosamine improved to 6.4%. Lab Results  Component Value Date   HGBA1C 8.4 12/19/2018   HGBA1C 7.1 (A) 02/17/2018   HGBA1C 10.2 06/23/2017  10/25/2017: HbA1c calculated from fructosamine was better, at 7.3%: 06/23/2017: HbA1c calculated from the fructosamine is higher than before, at 7.5% 02/23/2017: HbA1c calculated from the fructosamine is better, at 6.1%. 10/18/2016: HbA1c calculated from the fructosamine is slightly higher, at 6.7%. 06/22/2016: HbA1c calculated from the fructosamine is 6.3%. 02/23/2016: HbA1c calculated from the fructosamine is slightly higher, 6.35%. 10/23/2015: HbA1c calculated from fructosamine is as good as before: 5.7%! 07/24/2015: HbA1c calculated from fructosamine is much lower than the one measured, at 5.7%.  Pt is on a regimen of: - Metformin XR 1000 mg 2x a day, with meals >> 1000 mg with dinner - Jardiance 25 mg daily in am - Trulicity 3.53 mg weekly - initially indigestion and nausea >> resolved, but he continues on the low dose We stopped Glipizide 11/2018 He tried Januvia 2013 >> GERD, lack of appetite.  He had an episode of dizziness and sweating - could not check sugars but he fel better after  eating oranges  Pt checks his sugars 1-2 times a day: - am: 120-130, 180 >> 130-150 >> 136-140 - 2h after b'fast: 120-148 >> n/c >> 140-150, 209, 238 >> n/c - before lunch: 122-130 >> 50, 60, 95-112 >> 120-150 >> 130s - 2h after lunch:  105-140 >> 130s >> n/c - before dinner: 120-130 >> 120-150 >> 130-140 - 2h after dinner:  130-140 >> 130-140, 180 >> 150 >> 150s - bedtime:  see above >> 121-132 >> see above - nighttime: n/c Lowest sugar was  55 ... >> 80 >> 115; he has hypoglycemia awareness in the 80s. Highest sugar was 200 (gastroenteritis) >> 180s >> 238 >> 150s  He had the Freestyle Libre CGM >> not covered anymore.  Glucometer: ReliOn  Pt's meals are: - Breakfast: oatmeal + bacon or cold cereal + 2% milk - Lunch: sandwich or dinner leftovers - Dinner: meat + veggies + starch - Snacks: none Few days a week: runs in am.  -+ Stage III CKD, last BUN/creatinine:  12/19/2018: 38/2.1, GFR 40, glucose 161 06/12/2018: 50/2.39, glucose 155 05/13/2017: 34/1.92, glucose 185 Lab Results  Component Value Date   BUN 31 (A) 11/13/2015   CREATININE 1.4 (A) 11/13/2015  On lisinopril 20. -+ HL; latest lipids: 12/19/2018: 146/172/37/74 06/12/2018: 169/243/34/87 07/01/2017: 131/113/35/73 11/12/2016:119/296/25/106 - PCP recommended changing to Crestor.  Lab Results  Component Value Date   CHOL 183 11/13/2015   HDL 5 (A) 11/13/2015   TRIG 253 (A) 11/13/2015  05/03/2014: 154/193/26/90  On Crestor 40, fenofibrate, fish oil - Most recent eye exam: 10/2017: No DR he saw retina specialist at [facility]. Dr. Herbert Deaner.  +  L cataract. - nonumbness and tingling in his feet.  He also has HTN.  12/19/2018: TSH 2.06 TSH (12/12/2017): 1.88.  At the end of 2018 his wife passed away at [id number] after a craniotomy.  ROS: Constitutional: no weight gain/+ weight loss, no fatigue, no subjective hyperthermia, no subjective hypothermia Eyes: no blurry vision, no xerophthalmia ENT: no sore throat, no  nodules palpated in neck, no dysphagia, no odynophagia, no hoarseness Cardiovascular: no CP/no SOB/no palpitations/no leg swelling Respiratory: no cough/no SOB/no wheezing Gastrointestinal: + Resolved N/no V/no D/no C/no acid reflux Musculoskeletal: no muscle aches/no joint aches Skin: no rashes, no hair loss Neurological: no tremors/no numbness/no tingling/no dizziness  I reviewed pt's medications, allergies, PMH, social hx, family hx, and changes were documented in the history of present illness. Otherwise, unchanged from my initial visit note.  Past Medical History:  Diagnosis Date  . Chronic kidney disease    Stage III   . Diabetes mellitus without complication (HCC)   . History of seizures as a child   . Hyperlipidemia   . Hypertension    Past Surgical History:  Procedure Laterality Date  . HERNIA REPAIR    . KNEE ARTHROSCOPY    . TENDON REPAIR     r/acilles   History   Social History  . Marital Status: Married    Spouse Name: N/A  . Number of Children: 2   Occupational History  . Nurse [employer]   Social History Main Topics  . Smoking status: Never Smoker   . Smokeless tobacco: No  . Alcohol Use: Yes, beer, liquor 2-3x a week - 3 drinks  . Drug Use: No   Current Outpatient Medications on File Prior to Visit  Medication Sig Dispense Refill  . Dulaglutide (TRULICITY) 0.75 MG/0.5ML SOPN Inject 0.75 mg in am weekly under skin 4 pen 4  . empagliflozin (JARDIANCE) 25 MG TABS tablet Take 25 mg by mouth daily. 30 tablet 11  . EPINEPHrine (EPIPEN) 0.3 mg/0.3 mL SOAJ injection Inject 0.3 mg into the muscle once.    . fenofibrate (TRICOR) 48 MG tablet Take 145 mg by mouth at bedtime.     . fish oil-omega-3 fatty acids 1000 MG capsule Take 1 g by mouth every morning.    Marland Kitchen. lisinopril-hydrochlorothiazide (PRINZIDE,ZESTORETIC) 20-12.5 MG per tablet Take 1 tablet by mouth at bedtime.    . metFORMIN (GLUCOPHAGE-XR) 500 MG 24 hr tablet TAKE 2 TABLETS (1,000 MG TOTAL) BY MOUTH 2  TIMES DAILY. 360 tablet 3  . Probiotic Product (PROBIOTIC DAILY) CAPS Take 1 capsule by mouth daily.    . rosuvastatin (CRESTOR) 40 MG tablet Take 1 tablet by mouth daily.     No current facility-administered medications on file prior to visit.   Allergies  Allergen Reactions  . Bee Venom Anaphylaxis  . Ivp Dye [Iodinated Diagnostic Agents] Hives  . Januvia [Sitagliptin] Other (See Comments)    Induces acid reflux, no appetite   . Fluorescein Rash   Family History  Problem Relation Age of Onset  . Diabetes Mother   . Hyperlipidemia Mother   . Hypertension Mother   . Diabetes Father   . Hyperlipidemia Father   . Hypertension Father   . Cancer Other   . Hypertension Maternal Grandmother   . Hyperlipidemia Maternal Grandmother   . Cancer Maternal Grandmother        Lung cancer  . Hyperlipidemia Maternal Grandfather   . Hypertension Maternal Grandfather   . CVA Maternal Grandfather   . Cancer  Maternal Grandfather        Lung cancer  . Heart disease Paternal Grandmother        MI   . Stroke Paternal Grandmother   . Hypertension Paternal Grandmother   . Diabetes Paternal Grandfather   . Hyperlipidemia Paternal Grandfather    PE: BP 140/70   Pulse 84   Ht 5\' 10"  (1.778 m)   Wt 224 lb (101.6 kg)   SpO2 98%   BMI 32.14 kg/m  Body mass index is 32.14 kg/m. Wt Readings from Last 3 Encounters:  03/15/19 224 lb (101.6 kg)  12/21/18 231 lb (104.8 kg)  02/17/18 225 lb (102.1 kg)   Constitutional: overweight, in NAD Eyes: PERRLA, EOMI, no exophthalmos ENT: moist mucous membranes, no thyromegaly, no cervical lymphadenopathy Cardiovascular: RRR, No MRG Respiratory: CTA B Gastrointestinal: abdomen soft, NT, ND, BS+ Musculoskeletal: no deformities, strength intact in all 4 Skin: moist, warm, no rashes Neurological: no tremor with outstretched hands, DTR normal in all 4  ASSESSMENT: 1. DM2, non-insulin-dependent, more controlled lately, with complications - CKD stage 3 -  + mild DR  2. HL  3.  Obesity  PLAN:  1. Patient with longstanding, controlled, type 2 diabetes, with fairly good control on oral antidiabetic regimen with Metformin ER, SGLT2 inhibitor and now low-dose weekly GLP-1 receptor agonist.  At last visit, we changed from glipizide to low-dose Trulicity and he is feeling much better on this medication.  Initially he had nausea with it so he stayed with a lower dose of Trulicity.  His sugars feel less fluctuating.  However, he had a possible hypoglycemic episode (he did not check his sugars) last week so we have to be careful with further hypoglycemia.  I explained the mechanism of action of GLP-1 receptor agonist: Reducing gastric emptying, reducing glucagon level, but also stimulating his own production of insulin.  It is possible that this effect was more pronounced in the day of his hypoglycemia.  He does mention that he did not eat well that day and he was more active. -At this visit, sugars are at or close to goal throughout the day. -His HbA1c today is 8.5%, which is similar to before, however, his fructosamine levels are more accurate for him.  We will check this today. -I would not suggest to increase his Trulicity for now but may need to do so at next visit if sugars worsen. - I suggested to:  Patient Instructions  Please continue: - Metformin XR 1000 mg with dinner - Jardiance 25 mg daily in am - Trulicity 0.75 mg weekly  Please come back for a follow-up appointment in 3 to 4 months.  - advised to check sugars at different times of the day - 1-2x a day, rotating check times - advised for yearly eye exams >> he is UTD - return to clinic in 3-4 months      2. HL -Reviewed his latest lipid panel from 11/2018: LDL at goal as are the rest of the lipid fractions -Continues Crestor, fish oil, TriCor, with no side effects  3.  Obesity -Continue Jardiance and Trulicity which should also help with weight loss -lost 7 lbs since last  OV  Office Visit on 03/15/2019  Component Date Value Ref Range Status  . Fructosamine 03/15/2019 339* 205 - 285 umol/L Final  . Hemoglobin A1C 03/15/2019 8.5* 4.0 - 5.6 % Final   HbA1c calculated from fructosamine is higher than before, at 7.37%.  03/17/2019, MD PhD [facility]

## 2019-03-15 NOTE — Patient Instructions (Addendum)
Please continue: - Metformin XR 1000 mg with dinner - Jardiance 25 mg daily in am - Trulicity mg weekly  Please come back for a follow-up appointment in 4 months.

## 2019-03-16 MED FILL — TRULICITY 0.75 MG/0.5 ML PE: 0.75 | 28 days supply | Qty: 2 | Fill #1

## 2019-03-22 LAB — FRUCTOSAMINE: Fructosamine: 339 umol/L — ABNORMAL HIGH (ref 205–285)

## 2019-04-02 MED FILL — JARDIANCE 25 MG TABLET: 25 | 30 days supply | Qty: 30 | Fill #9

## 2019-04-06 MED FILL — metFORMIN HCL ER 500 MG TB2: 500 | 90 days supply | Qty: 360 | Fill #1

## 2019-04-17 MED FILL — TRULICITY 0.75 MG/0.5 ML PE: 0.75 | 28 days supply | Qty: 2 | Fill #2

## 2019-04-17 MED FILL — ROSUVASTATIN CALCIUM 40 MG: 40 | 90 days supply | Qty: 90 | Fill #1

## 2019-04-27 MED FILL — LISINOPRIL-HCTZ 20-12.5 MG: 20-12.5 | 90 days supply | Qty: 180 | Fill #1

## 2019-05-02 MED FILL — JARDIANCE 25 MG TABLET: 25 | 30 days supply | Qty: 30 | Fill #10

## 2019-05-15 MED FILL — TRULICITY 0.75 MG/0.5 ML PE: 0.75 | 28 days supply | Qty: 2 | Fill #3

## 2019-05-31 MED FILL — FENOFIBRATE 145 MG TABLET: 145 | 90 days supply | Qty: 90 | Fill #1

## 2019-05-31 MED FILL — JARDIANCE 25 MG TABLET: 25 | 30 days supply | Qty: 30 | Fill #11

## 2019-06-14 MED FILL — TRULICITY 0.75 MG/0.5 ML PE: 0.75 | 28 days supply | Qty: 2 | Fill #4

## 2019-07-03 ENCOUNTER — Other Ambulatory Visit: Payer: Self-pay | Admitting: Internal Medicine

## 2019-07-03 MED FILL — metFORMIN HCL ER 500 MG TB2: 500 | 90 days supply | Qty: 360 | Fill #2

## 2019-07-03 MED FILL — JARDIANCE 25 MG TABLET: 25 | 30 days supply | Qty: 30 | Fill #0

## 2019-07-19 ENCOUNTER — Encounter: Payer: Self-pay | Admitting: Internal Medicine

## 2019-07-19 ENCOUNTER — Ambulatory Visit: Payer: 59 | Admitting: Internal Medicine

## 2019-07-19 ENCOUNTER — Other Ambulatory Visit: Payer: Self-pay | Admitting: Internal Medicine

## 2019-07-19 ENCOUNTER — Other Ambulatory Visit: Payer: Self-pay

## 2019-07-19 VITALS — BP 130/80 | HR 74 | Ht 70.0 in | Wt 223.0 lb

## 2019-07-19 DIAGNOSIS — E669 Obesity, unspecified: Secondary | ICD-10-CM | POA: Diagnosis not present

## 2019-07-19 DIAGNOSIS — E785 Hyperlipidemia, unspecified: Secondary | ICD-10-CM | POA: Diagnosis not present

## 2019-07-19 DIAGNOSIS — E1121 Type 2 diabetes mellitus with diabetic nephropathy: Secondary | ICD-10-CM

## 2019-07-19 DIAGNOSIS — E1122 Type 2 diabetes mellitus with diabetic chronic kidney disease: Secondary | ICD-10-CM

## 2019-07-19 DIAGNOSIS — N1832 Chronic kidney disease, stage 3b: Secondary | ICD-10-CM | POA: Diagnosis not present

## 2019-07-19 LAB — POCT GLYCOSYLATED HEMOGLOBIN (HGB A1C): Hemoglobin A1C: 7.8 % — AB (ref 4.0–5.6)

## 2019-07-19 MED ORDER — TRULICITY 1.5 MG/0.5ML ~~LOC~~ SOAJ: 1.5000 mg | SUBCUTANEOUS | 11 refills | Status: DC

## 2019-07-19 MED FILL — TRULICITY 1.5 MG/0.5 ML PEN: 1.5 | 28 days supply | Qty: 2 | Fill #0

## 2019-07-19 NOTE — Progress Notes (Signed)
Patient ID: Jonathan Dalton, male   DOB: 1965-10-21, 54 y.o.   MRN: [id number]  This visit occurred during the SARS-CoV-2 public health emergency.  Safety protocols were in place, including screening questions prior to the visit, additional usage of staff PPE, and extensive cleaning of exam room while observing appropriate contact time as indicated for disinfecting solutions.   HPI: Jonathan Dalton is a 54 y.o.-year-old male, presents for f/u for DM2, dx in 1994, non-insulin-dependent, uncontrolled, with complications (CKD stage 3, mild DR). Last visit 4 months ago.  Reviewed HbA1c levels: 03/15/2019: HbA1c calculated from fructosamine is higher than before, at 7.37%. Lab Results  Component Value Date   HGBA1C 8.5 (A) 03/15/2019   HGBA1C 8.4 12/19/2018   HGBA1C 7.1 (A) 02/17/2018  12/21/2018: HbA1c calculated from fructosamine improved to 6.4%. 10/25/2017: HbA1c calculated from fructosamine was better, at 7.3%: 06/23/2017: HbA1c calculated from the fructosamine is higher than before, at 7.5% 02/23/2017: HbA1c calculated from the fructosamine is better, at 6.1%. 10/18/2016: HbA1c calculated from the fructosamine is slightly higher, at 6.7%. 06/22/2016: HbA1c calculated from the fructosamine is 6.3%. 02/23/2016: HbA1c calculated from the fructosamine is slightly higher, 6.35%. 10/23/2015: HbA1c calculated from fructosamine is as good as before: 5.7%! 07/24/2015: HbA1c calculated from fructosamine is much lower than the one measured, at 5.7%.  Pt is on a regimen of: - Metformin XR 1000 mg 2x a day, with meals >> 1000 mg with dinner - Jardiance 25 mg daily in am - Trulicity 2.70 mg weekly -initially had indigestion and nausea but this resolved We stopped Glipizide 11/2018 He tried Januvia 2013 >> GERD, lack of appetite.  Pt checks his sugars 1-2 times a day: - am: 120-130, 180 >> 130-150 >> 136-140 >> 98, 120-140 - 2h after b'fast: 120-148 >> n/c >> 140-150, 209, 238 >> n/c >> 148 - before  lunch: 50, 60, 95-112 >> 120-150 >> 130s >> 110-130, 160 (afer mid-am snack - fruit) - 2h after lunch:  105-140 >> 130s >> n/c - before dinner: 120-130 >> 120-150 >> 130-140 >> 120-142, 170 (after exercise) - 2h after dinner:  130-140 >> 130-140, 180 >> 150 >> 150s >> n/c - bedtime:  see above >> 121-132 >> see above >> 135-135 - nighttime: n/c Lowest sugar was  55 ... >> 80 >> 115 >> 98; he has hypoglycemia awareness in the 80s. Highest sugar was 200 (gastroenteritis) >> 180s >> 238 >> 150s >> 170  He had the Freestyle Libre CGM >> not covered anymore.  Glucometer: ReliOn  Pt's meals are: - Breakfast: oatmeal + bacon or cold cereal + 2% milk - Lunch: sandwich or dinner leftovers - Dinner: meat + veggies + starch - Snacks: none For exercise, he runs and also does strength exercises.  -+ Stage III CKD, last BUN/creatinine:  12/19/2018: 38/2.1, GFR 40, glucose 161 06/12/2018: 50/2.39, glucose 155 05/13/2017: 34/1.92, glucose 185 Lab Results  Component Value Date   BUN 31 (A) 11/13/2015   CREATININE 1.4 (A) 11/13/2015  On lisinopril 20. -+ HL; latest lipids: 12/19/2018: 146/172/37/74 06/12/2018: 169/243/34/87 07/01/2017: 131/113/35/73 11/12/2016:119/296/25/106 - PCP recommended changing to Crestor.  Lab Results  Component Value Date   CHOL 183 11/13/2015   HDL 5 (A) 11/13/2015   TRIG 253 (A) 11/13/2015  05/03/2014: 154/193/26/90  On Crestor 40, fenofibrate, fish oil - Most recent eye exam: 10/2018: No DR, + macular edema reportedly; he saw retina specialist at [facility]. Dr. Herbert Deaner.  He has left cataract.  -No numbness and tingling  in his feet.  He also has HTN.  12/19/2018: TSH 2.06 TSH (12/12/2017): 1.88.  At the end of 08-07-16 his wife passed away at 65 after a craniotomy.  ROS: Constitutional: no weight gain/no weight loss, no fatigue, no subjective hyperthermia, no subjective hypothermia Eyes: no blurry vision, no xerophthalmia ENT: no sore throat, no nodules  palpated in neck, no dysphagia, no odynophagia, no hoarseness Cardiovascular: no CP/no SOB/no palpitations/no leg swelling Respiratory: no cough/no SOB/no wheezing Gastrointestinal: no N/no V/no D/no C/no acid reflux Musculoskeletal: no muscle aches/no joint aches Skin: no rashes, no hair loss Neurological: no tremors/no numbness/no tingling/no dizziness  I reviewed pt's medications, allergies, PMH, social hx, family hx, and changes were documented in the history of present illness. Otherwise, unchanged from my initial visit note.  Past Medical History:  Diagnosis Date  . Chronic kidney disease    Stage III   . Diabetes mellitus without complication (HCC)   . History of seizures as a child   . Hyperlipidemia   . Hypertension    Past Surgical History:  Procedure Laterality Date  . HERNIA REPAIR    . KNEE ARTHROSCOPY    . TENDON REPAIR     r/acilles   History   Social History  . Marital Status: Married    Spouse Name: N/A  . Number of Children: 2   Occupational History  . Nurse [employer]   Social History Main Topics  . Smoking status: Never Smoker   . Smokeless tobacco: No  . Alcohol Use: Yes, beer, liquor 2-3x a week - 3 drinks  . Drug Use: No   Current Outpatient Medications on File Prior to Visit  Medication Sig Dispense Refill  . Dulaglutide (TRULICITY) 0.75 MG/0.5ML SOPN Inject 0.75 mg in am weekly under skin 4 pen 4  . EPINEPHrine (EPIPEN) 0.3 mg/0.3 mL SOAJ injection Inject 0.3 mg into the muscle once.    . fenofibrate (TRICOR) 48 MG tablet Take 145 mg by mouth at bedtime.     . fish oil-omega-3 fatty acids 1000 MG capsule Take 1 g by mouth every morning.    Marland Kitchen JARDIANCE 25 MG TABS tablet TAKE 1 TABLET BY MOUTH ONCE DAILY 30 tablet 11  . lisinopril-hydrochlorothiazide (PRINZIDE,ZESTORETIC) 20-12.5 MG per tablet Take 1 tablet by mouth at bedtime.    . metFORMIN (GLUCOPHAGE-XR) 500 MG 24 hr tablet TAKE 2 TABLETS (1,000 MG TOTAL) BY MOUTH 2 TIMES DAILY. 360 tablet  3  . Probiotic Product (PROBIOTIC DAILY) CAPS Take 1 capsule by mouth daily.    . rosuvastatin (CRESTOR) 40 MG tablet Take 1 tablet by mouth daily.     No current facility-administered medications on file prior to visit.   Allergies  Allergen Reactions  . Bee Venom Anaphylaxis  . Ivp Dye [Iodinated Diagnostic Agents] Hives  . Januvia [Sitagliptin] Other (See Comments)    Induces acid reflux, no appetite   . Fluorescein Rash   Family History  Problem Relation Age of Onset  . Diabetes Mother   . Hyperlipidemia Mother   . Hypertension Mother   . Diabetes Father   . Hyperlipidemia Father   . Hypertension Father   . Cancer Other   . Hypertension Maternal Grandmother   . Hyperlipidemia Maternal Grandmother   . Cancer Maternal Grandmother        Lung cancer  . Hyperlipidemia Maternal Grandfather   . Hypertension Maternal Grandfather   . CVA Maternal Grandfather   . Cancer Maternal Grandfather  Lung cancer  . Heart disease Paternal Grandmother        MI   . Stroke Paternal Grandmother   . Hypertension Paternal Grandmother   . Diabetes Paternal Grandfather   . Hyperlipidemia Paternal Grandfather    PE: BP 130/80   Pulse 74   Ht 5\' 10"  (1.778 m)   Wt 223 lb (101.2 kg)   SpO2 98%   BMI 32.00 kg/m  Body mass index is 32 kg/m. Wt Readings from Last 3 Encounters:  07/19/19 223 lb (101.2 kg)  03/15/19 224 lb (101.6 kg)  12/21/18 231 lb (104.8 kg)   Constitutional: overweight, in NAD Eyes: PERRLA, EOMI, no exophthalmos ENT: moist mucous membranes, no thyromegaly, no cervical lymphadenopathy Cardiovascular: RRR, No MRG Respiratory: CTA B Gastrointestinal: abdomen soft, NT, ND, BS+ Musculoskeletal: no deformities, strength intact in all 4 Skin: moist, warm, no rashes Neurological: no tremor with outstretched hands, DTR normal in all 4  ASSESSMENT: 1. DM2, non-insulin-dependent, more controlled lately, with complications - CKD stage 3 - + mild DR  2. HL  3.   Obesity class I  PLAN:  1. Patient with longstanding, type 2 diabetes, with fairly good control on oral antidiabetic regimen with Metformin and SGLT2 inhibitor and also weekly GLP-1 receptor agonist.  After we started Trulicity, he initially had nausea so he stayed with a lower dose.  Nausea resolved and he is not feeling great on the medication.  At this visit, sugars are still at goal most of the time, with only few exceptions, and he is wondering whether he can increase the dose of Trulicity.  We will increase the dose to 1.5 mg weekly but I advised him to let me know if he has any low blood sugars while on this dose. -At last visit, HbA1c was 8.5%, but the HbA1c calculated from fructosamine was 7.37%, higher.  At this visit, HbA1c: 7.8% (decreased) - will also check a fructosamine - I suggested to:  Patient Instructions  Please continue: - Metformin XR 1000 mg with dinner - Jardiance 25 mg daily in am  Please increase: - Trulicity 1.5 mg weekly  Please come back for a follow-up appointment in 3 to 4 months.  - advised to check sugars at different times of the day - 1x a day, rotating check times - advised for yearly eye exams >> he is UTD - return to clinic in 3-4 months     2. HL -Review latest lipid panel from 11/2018: LDL at goal, as are the rest of the lipid fractions -He continues on Crestor, fish oil, TriCor, with no side effects  3.  Obesity -Continue Jardiance and Trulicity, which should also help with weight loss.  We will also increase the dose of Trulicity, which should also help with weight loss. -He lost 7 pounds before last visit, weight stable since then however, he feels that his central fat has decreased.  He continues to do strength exercises.  Office Visit on 07/19/2019  Component Date Value Ref Range Status  . Fructosamine 07/19/2019 297* 205 - 285 umol/L Final  . Hemoglobin A1C 07/19/2019 7.8* 4.0 - 5.6 % Final   The HbA1c calculated from fructosamine is  6.6%.  07/21/2019, MD PhD [facility] Endocrinology

## 2019-07-19 NOTE — Patient Instructions (Addendum)
Please continue: - Metformin XR 1000 mg with dinner - Jardiance 25 mg daily in am  Please increase: - Trulicity 1.5 mg weekly  Please come back for a follow-up appointment in 3 to 4 months.

## 2019-07-23 MED FILL — ROSUVASTATIN CALCIUM 40 MG: 40 | 90 days supply | Qty: 90 | Fill #0

## 2019-07-24 LAB — FRUCTOSAMINE: Fructosamine: 297 umol/L — ABNORMAL HIGH (ref 205–285)

## 2019-07-26 MED FILL — LISINOPRIL-HCTZ 20-12.5 MG: 20-12.5 | 90 days supply | Qty: 180 | Fill #0

## 2019-08-01 MED FILL — JARDIANCE 25 MG TABLET: 25 | 30 days supply | Qty: 30 | Fill #1

## 2019-08-13 MED FILL — TRULICITY 1.5 MG/0.5 ML PEN: 1.5 | 28 days supply | Qty: 2 | Fill #1

## 2019-08-30 MED FILL — JARDIANCE 25 MG TABLET: 25 | 30 days supply | Qty: 30 | Fill #2

## 2019-09-13 MED FILL — TRULICITY 1.5 MG/0.5 ML PEN: 1.5 | 28 days supply | Qty: 2 | Fill #2

## 2019-09-28 MED FILL — JARDIANCE 25 MG TABLET: 25 | 30 days supply | Qty: 30 | Fill #3

## 2019-10-11 MED FILL — TRULICITY 1.5 MG/0.5 ML PEN: 1.5 | 28 days supply | Qty: 2 | Fill #3

## 2019-10-26 ENCOUNTER — Other Ambulatory Visit (HOSPITAL_COMMUNITY): Payer: Self-pay | Admitting: Family Medicine

## 2019-10-26 DIAGNOSIS — E785 Hyperlipidemia, unspecified: Secondary | ICD-10-CM | POA: Diagnosis not present

## 2019-10-26 DIAGNOSIS — I10 Essential (primary) hypertension: Secondary | ICD-10-CM | POA: Diagnosis not present

## 2019-10-26 DIAGNOSIS — Z23 Encounter for immunization: Secondary | ICD-10-CM | POA: Diagnosis not present

## 2019-10-27 MED FILL — ROSUVASTATIN CALCIUM 40 MG: 40 | 90 days supply | Qty: 90 | Fill #0

## 2019-10-27 MED FILL — FENOFIBRATE 145 MG TABS: 145 | 90 days supply | Qty: 90 | Fill #0

## 2019-10-27 MED FILL — LISINOPRIL-HCTZ 20-12.5 MG: 20-12.5 | 90 days supply | Qty: 180 | Fill #0

## 2019-10-30 MED FILL — JARDIANCE 25 MG TABLET: 25 | 30 days supply | Qty: 30 | Fill #4

## 2019-11-08 MED FILL — TRULICITY 1.5 MG/0.5 ML PEN: 1.5 | 28 days supply | Qty: 2 | Fill #4

## 2019-11-22 ENCOUNTER — Ambulatory Visit: Payer: 59 | Admitting: Internal Medicine

## 2019-11-22 ENCOUNTER — Other Ambulatory Visit: Payer: Self-pay

## 2019-11-22 ENCOUNTER — Encounter: Payer: Self-pay | Admitting: Internal Medicine

## 2019-11-22 VITALS — BP 140/70 | HR 76 | Ht 70.0 in | Wt 221.0 lb

## 2019-11-22 DIAGNOSIS — N1832 Chronic kidney disease, stage 3b: Secondary | ICD-10-CM

## 2019-11-22 DIAGNOSIS — E669 Obesity, unspecified: Secondary | ICD-10-CM

## 2019-11-22 DIAGNOSIS — E785 Hyperlipidemia, unspecified: Secondary | ICD-10-CM

## 2019-11-22 DIAGNOSIS — E1121 Type 2 diabetes mellitus with diabetic nephropathy: Secondary | ICD-10-CM

## 2019-11-22 DIAGNOSIS — E1122 Type 2 diabetes mellitus with diabetic chronic kidney disease: Secondary | ICD-10-CM

## 2019-11-22 LAB — POCT GLYCOSYLATED HEMOGLOBIN (HGB A1C): Hemoglobin A1C: 7.9 % — AB (ref 4.0–5.6)

## 2019-11-22 NOTE — Patient Instructions (Signed)
Please continue: - Metformin XR 1000 mg with dinner - Jardiance 25 mg daily in am - Trulicity 1.5 mg weekly  Please come back for a follow-up appointment in 3 to 4 months.

## 2019-11-22 NOTE — Progress Notes (Addendum)
Patient ID: Jonathan BarkerWilliam B Dalton, male   DOB: 09/18/1965, 54 y.o.   MRN: [id number]  This visit occurred during the SARS-CoV-2 public health emergency.  Safety protocols were in place, including screening questions prior to the visit, additional usage of staff PPE, and extensive cleaning of exam room while observing appropriate contact time as indicated for disinfecting solutions.   HPI: Jonathan Dalton is a 54 y.o.-year-old male, presents for f/u for DM2, dx in 1994, non-insulin-dependent, uncontrolled, with complications (CKD stage 3, mild DR). Last visit 4 months ago.  Reviewed HbA1c levels: 07/19/2019: The HbA1c calculated from fructosamine is 6.6%. Lab Results  Component Value Date   HGBA1C 7.8 (A) 07/19/2019   HGBA1C 8.5 (A) 03/15/2019   HGBA1C 8.4 12/19/2018  03/15/2019: HbA1c calculated from fructosamine is higher than before, at 7.37% 12/21/2018: HbA1c calculated from fructosamine improved to 6.4%. 10/25/2017: HbA1c calculated from fructosamine was better, at 7.3%: 06/23/2017: HbA1c calculated from the fructosamine is higher than before, at 7.5% 02/23/2017: HbA1c calculated from the fructosamine is better, at 6.1%. 10/18/2016: HbA1c calculated from the fructosamine is slightly higher, at 6.7%. 06/22/2016: HbA1c calculated from the fructosamine is 6.3%. 02/23/2016: HbA1c calculated from the fructosamine is slightly higher, 6.35%. 10/23/2015: HbA1c calculated from fructosamine is as good as before: 5.7%! 07/24/2015: HbA1c calculated from fructosamine is much lower than the one measured, at 5.7%.  Pt is on a regimen of: - Metformin XR 1000 mg 2x a day, with meals >> 1000 mg with dinner - Jardiance 25 mg daily in am - Trulicity 0.75 mg weekly -initially had indigestion and nausea but this resolved >> 1.5 mg weekly We stopped Glipizide 11/2018 He tried Januvia 2013 >> GERD, lack of appetite.  Pt checks his sugars 1-2 times a day: - am: 136-140 >> 98, 120-140 >> 100, 142-145, 170 - 2h after  b'fast: 140-150, 209, 238 >> n/c >> 148 >> 130s - before lunch:  130s >> 110-130, 160 (snack)  >> 76, 130s - 2h after lunch:  105-140 >> 130s >> n/c - before dinner: 130-140 >> 120-142, 170 (exercise) >> 120-140 - 2h after dinner: 130-140, 180 >> 150 >> 150s >> n/c - bedtime:  121-132 >> see above >> 135 >> 120, 140-160 - nighttime: n/c Lowest sugar was  55 .Marland Kitchen.. 115 >> 98 >> 76 (delayed a meal x1); he has hypoglycemia awareness in the 70s. Highest sugar was 200 (gastroenteritis) >>... 150s >> 170 >> 180  He had the Freestyle Libre CGM >> not covered anymore.  Glucometer: ReliOn  Pt's meals are: - Breakfast: oatmeal + bacon or cold cereal + 2% milk - Lunch: sandwich or dinner leftovers - Dinner: meat + veggies + starch - Snacks: none For exercise, he runs and also does strength exercises. -+ Stage III CKD, last BUN/creatinine:  10/26/2019: 39/1.6 12/19/2018: 38/2.1, GFR 40, glucose 161 06/12/2018: 50/2.39, glucose 155 05/13/2017: 34/1.92, glucose 185 Lab Results  Component Value Date   BUN 31 (A) 11/13/2015   CREATININE 1.4 (A) 11/13/2015  On lisinopril 20. -+ HL; latest lipids: 10/26/2019: 112/104/38/54 12/19/2018: 146/172/37/74 06/12/2018: 169/243/34/87 07/01/2017: 131/113/35/73 11/12/2016:119/296/25/106 - PCP recommended changing to Crestor.  Lab Results  Component Value Date   CHOL 183 11/13/2015   HDL 5 (A) 11/13/2015   TRIG 253 (A) 11/13/2015  05/03/2014: 154/193/26/90  On Crestor 40, fenofibrate, fish oil - Most recent eye exam: 10/2018: No DR, + macular edema reportedly; he saw retina specialist at [facility]. Dr. Elmer Dalton.  He has left cataract.  - no  numbness and tingling in his feet.  He also has HTN.  12/19/2018: TSH 2.06 TSH (12/12/2017): 1.88.  At the end of 07-31-16 his wife passed away at 68 after a craniotomy.  ROS: Constitutional: no weight gain/no weight loss, no fatigue, no subjective hyperthermia, no subjective hypothermia Eyes: no blurry vision,  no xerophthalmia ENT: no sore throat, no nodules palpated in neck, no dysphagia, no odynophagia, no hoarseness Cardiovascular: no CP/no SOB/no palpitations/no leg swelling Respiratory: no cough/no SOB/no wheezing Gastrointestinal: no N/no V/no D/no C/no acid reflux Musculoskeletal: no muscle aches/no joint aches Skin: no rashes, no hair loss Neurological: no tremors/no numbness/no tingling/no dizziness  I reviewed pt's medications, allergies, PMH, social hx, family hx, and changes were documented in the history of present illness. Otherwise, unchanged from my initial visit note.  Past Medical History:  Diagnosis Date  . Chronic kidney disease    Stage III   . Diabetes mellitus without complication (HCC)   . History of seizures as a child   . Hyperlipidemia   . Hypertension    Past Surgical History:  Procedure Laterality Date  . HERNIA REPAIR    . KNEE ARTHROSCOPY    . TENDON REPAIR     r/acilles   History   Social History  . Marital Status: Married    Spouse Name: N/A  . Number of Children: 2   Occupational History  . 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 Smoking status: Never Smoker   . Smokeless tobacco: No  . Alcohol Use: Yes, beer, liquor 2-3x a week - 3 drinks  . Drug Use: No   Current Outpatient Medications on File Prior to Visit  Medication Sig Dispense Refill  . Dulaglutide (TRULICITY) 1.5 MG/0.5ML SOPN Inject 1.5 mg into the skin once a week. 4 pen 11  . EPINEPHrine (EPIPEN) 0.3 mg/0.3 mL SOAJ injection Inject 0.3 mg into the muscle once.    . fenofibrate (TRICOR) 48 MG tablet Take 145 mg by mouth at bedtime.     . fish oil-omega-3 fatty acids 1000 MG capsule Take 1 g by mouth every morning.    Marland Kitchen JARDIANCE 25 MG TABS tablet TAKE 1 TABLET BY MOUTH ONCE DAILY 30 tablet 11  . lisinopril-hydrochlorothiazide (PRINZIDE,ZESTORETIC) 20-12.5 MG per tablet Take 1 tablet by mouth at bedtime.    . metFORMIN (GLUCOPHAGE-XR) 500 MG 24 hr tablet TAKE 2 TABLETS  (1,000 MG TOTAL) BY MOUTH 2 TIMES DAILY. 360 tablet 3  . Probiotic Product (PROBIOTIC DAILY) CAPS Take 1 capsule by mouth daily.    . rosuvastatin (CRESTOR) 40 MG tablet Take 1 tablet by mouth daily.     No current facility-administered medications on file prior to visit.   Allergies  Allergen Reactions  . Bee Venom Anaphylaxis  . Ivp Dye [Iodinated Diagnostic Agents] Hives  . Januvia [Sitagliptin] Other (See Comments)    Induces acid reflux, no appetite   . Fluorescein Rash   Family History  Problem Relation Age of Onset  . Diabetes Mother   . Hyperlipidemia Mother   . Hypertension Mother   . Diabetes Father   . Hyperlipidemia Father   . Hypertension Father   . Cancer Other   . Hypertension Maternal Grandmother   . Hyperlipidemia Maternal Grandmother   . Cancer Maternal Grandmother        Lung cancer  . Hyperlipidemia Maternal Grandfather   . Hypertension Maternal Grandfather   . CVA Maternal Grandfather   . Cancer Maternal Grandfather  Lung cancer  . Heart disease Paternal Grandmother        MI   . Stroke Paternal Grandmother   . Hypertension Paternal Grandmother   . Diabetes Paternal Grandfather   . Hyperlipidemia Paternal Grandfather    PE: BP 140/70   Pulse 76   Ht 5\' 10"  (1.778 m)   Wt 221 lb (100.2 kg)   SpO2 98%   BMI 31.71 kg/m  Body mass index is 31.71 kg/m. Wt Readings from Last 3 Encounters:  11/22/19 221 lb (100.2 kg)  07/19/19 223 lb (101.2 kg)  03/15/19 224 lb (101.6 kg)   Constitutional: overweight, in NAD Eyes: PERRLA, EOMI, no exophthalmos ENT: moist mucous membranes, no thyromegaly, no cervical lymphadenopathy Cardiovascular: RRR, No MRG Respiratory: CTA B Gastrointestinal: abdomen soft, NT, ND, BS+ Musculoskeletal: no deformities, strength intact in all 4 Skin: moist, warm, no rashes Neurological: no tremor with outstretched hands, DTR normal in all 4  ASSESSMENT: 1. DM2, non-insulin-dependent, more controlled lately, with  complications - CKD stage 3 - + mild DR  2. HL  3.  Obesity class I  PLAN:  1. Patient with longstanding, type 2 diabetes, with fairly good control, on oral antidiabetic regimen with Metformin extended release and SGLT2 inhibitor and also weekly GLP-1 receptor agonist which we increased at last visit.  He initially had nausea with a lower dose of Trulicity but then he started to tolerate it well and at last visit suggested an increase in dose since sugars were higher than before.   At that time, an HbA1c calculated from fructosamine was 6.6%. -At this visit, reviewing his sugars at home, they are slightly higher than before, without a clear explanation other than possible dehydration as he is working outside a lot and also exercising consistently.  We discussed about staying well-hydrated.  He did have several blood sugars in the 70s, in 1 case, after delaying breakfast.  Otherwise, it could have happened after a more active day.  He is usually feeling them and able to correct them.  He did not have significantly high blood sugar except for CBG in the 170s in the morning, which she cannot explain. -At this visit, we discussed about continuing the current regimen but at next visit, we may need to increase his Trulicity dose milligrams weekly - I suggested to:  Patient Instructions  Please continue: - Metformin XR 1000 mg with dinner - Jardiance 25 mg daily in am - Trulicity 1.5 mg weekly  Please come back for a follow-up appointment in 3 to 4 months.  - we we will check a fructosamine level at this visit, since this is more accurate for him than the directly measured HbA1c level - advised to check sugars at different times of the day - 1x a day, rotating check times - advised for yearly eye exams >> he is UTD - return to clinic in 3-4 months     2. HL -Reviewed the latest lipid panel from 09/2019: LDL at goal, as are the rest of the lipid fractions -He continues on Crestor, fish oil,  TriCor, without side effects  3.  Obesity class I -continue SGLT 2 inhibitor and GLP-1 receptor agonist which should also help with weight loss -He continues to exercise consistently -Lost 2 pounds since last visit  Component     Latest Ref Rng & Units 11/22/2019  Hemoglobin A1C     4.0 - 5.6 % 7.9 (A)  Fructosamine     205 - 285 umol/L 309 (  H)   11/22/2019: HbA1c calculated from fructosamine is 6.86%, slightly higher than before but still at goal.  Carlus Pavlov, MD PhD [facility] Endocrinology

## 2019-11-26 LAB — FRUCTOSAMINE: Fructosamine: 309 umol/L — ABNORMAL HIGH (ref 205–285)

## 2019-11-29 MED FILL — JARDIANCE 25 MG TABLET: 25 | 30 days supply | Qty: 30 | Fill #5

## 2019-12-06 MED FILL — TRULICITY 1.5 MG/0.5 ML PEN: 1.5 | 28 days supply | Qty: 2 | Fill #5

## 2019-12-28 MED FILL — JARDIANCE 25 MG TABLET: 25 | 30 days supply | Qty: 30 | Fill #6

## 2020-01-02 MED FILL — metFORMIN HCL ER 500 MG TB2: 500 | 90 days supply | Qty: 360 | Fill #3

## 2020-01-02 MED FILL — TRULICITY 1.5 MG/0.5 ML PEN: 1.5 | 84 days supply | Qty: 6 | Fill #6

## 2020-01-28 MED FILL — JARDIANCE 25 MG TABLET: 25 | 30 days supply | Qty: 30 | Fill #7

## 2020-01-28 MED FILL — ROSUVASTATIN CALCIUM 40 MG: 40 | 90 days supply | Qty: 90 | Fill #1

## 2020-01-28 MED FILL — LISINOPRIL-HCTZ 20-12.5 MG: 20-12.5 | 90 days supply | Qty: 180 | Fill #1

## 2020-01-28 MED FILL — FENOFIBRATE 145 MG TABS: 145 | 90 days supply | Qty: 90 | Fill #1

## 2020-02-28 MED FILL — JARDIANCE 25 MG TABLET: 25 | 30 days supply | Qty: 30 | Fill #8

## 2020-03-20 ENCOUNTER — Ambulatory Visit: Payer: 59 | Admitting: Internal Medicine

## 2020-03-30 MED FILL — TRULICITY 1.5 MG/0.5 ML PEN: 1.5 | 84 days supply | Qty: 6 | Fill #7

## 2020-03-31 MED FILL — JARDIANCE 25 MG TABLET: 25 | 30 days supply | Qty: 30 | Fill #9

## 2020-04-15 ENCOUNTER — Ambulatory Visit: Payer: 59 | Admitting: Internal Medicine

## 2020-04-15 ENCOUNTER — Other Ambulatory Visit: Payer: Self-pay

## 2020-04-15 ENCOUNTER — Encounter: Payer: Self-pay | Admitting: Internal Medicine

## 2020-04-15 VITALS — BP 150/90 | HR 72 | Ht 70.0 in | Wt 220.0 lb

## 2020-04-15 DIAGNOSIS — E785 Hyperlipidemia, unspecified: Secondary | ICD-10-CM

## 2020-04-15 DIAGNOSIS — N1832 Chronic kidney disease, stage 3b: Secondary | ICD-10-CM | POA: Diagnosis not present

## 2020-04-15 DIAGNOSIS — E669 Obesity, unspecified: Secondary | ICD-10-CM

## 2020-04-15 DIAGNOSIS — E1122 Type 2 diabetes mellitus with diabetic chronic kidney disease: Secondary | ICD-10-CM

## 2020-04-15 LAB — POCT GLYCOSYLATED HEMOGLOBIN (HGB A1C): Hemoglobin A1C: 8.1 % — AB (ref 4.0–5.6)

## 2020-04-15 NOTE — Patient Instructions (Addendum)
Please continue: - Metformin XR 1000 mg with dinner - Jardiance 25 mg daily in am - Trulicity 1.5 mg weekly  Please stop at the lab.  Change b'fast as discussed.  Please come back for a follow-up appointment in 3 to 4 months.

## 2020-04-15 NOTE — Progress Notes (Addendum)
Patient ID: Jonathan Dalton, male   DOB: Aug 14, 1965, 55 y.o.   MRN: [id number]  This visit occurred during the SARS-CoV-2 public health emergency.  Safety protocols were in place, including screening questions prior to the visit, additional usage of staff PPE, and extensive cleaning of exam room while observing appropriate contact time as indicated for disinfecting solutions.   HPI: Jonathan Dalton is a 55 y.o.-year-old male, presents for f/u for DM2, dx in 1994, non-insulin-dependent, uncontrolled, with complications (CKD stage 3, mild DR). Last visit 4 months ago.  Reviewed HbA1c levels: 11/22/2019: HbA1c calculated from fructosamine is 6.86%, slightly higher than before but still at goal. 07/19/2019: HbA1c calculated from fructosamine is 6.6%. Lab Results  Component Value Date   HGBA1C 7.9 (A) 11/22/2019   HGBA1C 7.8 (A) 07/19/2019   HGBA1C 8.5 (A) 03/15/2019  03/15/2019: HbA1c calculated from fructosamine is higher than before, at 7.37% 12/21/2018: HbA1c calculated from fructosamine improved to 6.4%. 10/25/2017: HbA1c calculated from fructosamine was better, at 7.3%: 06/23/2017: HbA1c calculated from the fructosamine is higher than before, at 7.5% 02/23/2017: HbA1c calculated from the fructosamine is better, at 6.1%. 10/18/2016: HbA1c calculated from the fructosamine is slightly higher, at 6.7%. 06/22/2016: HbA1c calculated from the fructosamine is 6.3%. 02/23/2016: HbA1c calculated from the fructosamine is slightly higher, 6.35%. 10/23/2015: HbA1c calculated from fructosamine is as good as before: 5.7%! 07/24/2015: HbA1c calculated from fructosamine is much lower than the one measured, at 5.7%.  Pt is on a regimen of: - Metformin XR 1000 mg 2x a day, with meals >> 1000 mg with dinner - Jardiance 25 mg daily in am - Trulicity 0.75 mg weekly -initially had indigestion and nausea but this resolved >> 1.5 mg weekly We stopped Glipizide 11/2018 He tried Januvia 2013 >> GERD, lack of  appetite.  Pt checks his sugars 1-2 times a day: - am: 136-140 >> 98, 120-140 >> 100, 142-145, 170 >> 107, 120-130 - 2h after b'fast: 140-150, 209, 238 >> n/c >> 148 >> 130s >> 78, 88-150 - before lunch:  130s >> 110-130, 160 (snack)  >> 76, 130s >> 128-133 - 2h after lunch:  105-140 >> 130s >> n/c - before dinner: 130-140 >> 120-142, 170 (exercise) >> 120-140 >> 115-140, 180 (snack) - 2h after dinner: 130-140, 180 >> 150 >> 150s >> n/c - bedtime:  121-132 >> see above >> 135 >> 120, 140-160 >> 120-135 - nighttime: n/c Lowest sugar was  55 ... 76 (delayed a meal x1) >> 78-88 (felt them - burning up); he has hypoglycemia awareness in the 80s. Highest sugar was 200 (gastroenteritis) >>... 170 >> 180  He had the Freestyle Libre CGM >> not covered anymore.  Glucometer: ReliOn  Pt's meals are: - Breakfast: oatmeal + bacon or cold cereal + 2% milk >> grits or oatmeal - Lunch: sandwich or dinner leftovers - Dinner: meat + veggies + starch - Snacks: none For exercise, he runs and also does strength exercises.  -+ Stage III CKD, last BUN/creatinine:  10/26/2019: 39/1.6 12/19/2018: 38/2.1, GFR 40, glucose 161 06/12/2018: 50/2.39, glucose 155 05/13/2017: 34/1.92, glucose 185 Lab Results  Component Value Date   BUN 31 (A) 11/13/2015   CREATININE 1.4 (A) 11/13/2015  On lisinopril 20. -+ HL; latest lipids: 10/26/2019: 112/104/38/54 12/19/2018: 146/172/37/74 06/12/2018: 169/243/34/87 07/01/2017: 131/113/35/73 11/12/2016:119/296/25/106 - PCP recommended changing to Crestor.  Lab Results  Component Value Date   CHOL 183 11/13/2015   HDL 5 (A) 11/13/2015   TRIG 253 (A) 11/13/2015  05/03/2014: 154/193/26/90  On  Crestor 40, fenofibrate 48, fish oil -Most recent eye exam 10/2018: No DR, + macular edema reportedly; he saw retina specialist at [facility]. Dr. Elmer Picker.  He has a left-sided cataract. Coming up in 04/2020. -No numbness and tingling in his feet.  He also has  HTN.  12/19/2018: TSH 2.06 TSH (12/12/2017): 1.88.  At the end of 19-Jul-2016 his wife passed away at 50 after a craniotomy.  ROS: Constitutional: no weight gain/no weight loss, no fatigue, no subjective hyperthermia, no subjective hypothermia Eyes: no blurry vision, no xerophthalmia ENT: no sore throat, no nodules palpated in neck, no dysphagia, no odynophagia, no hoarseness Cardiovascular: no CP/no SOB/no palpitations/no leg swelling Respiratory: no cough/no SOB/no wheezing Gastrointestinal: no N/no V/no D/no C/no acid reflux Musculoskeletal: no muscle aches/no joint aches Skin: no rashes, no hair loss Neurological: no tremors/no numbness/no tingling/no dizziness  I reviewed pt's medications, allergies, PMH, social hx, family hx, and changes were documented in the history of present illness. Otherwise, unchanged from my initial visit note.  Past Medical History:  Diagnosis Date  . Chronic kidney disease    Stage III   . Diabetes mellitus without complication (HCC)   . History of seizures as a child   . Hyperlipidemia   . Hypertension    Past Surgical History:  Procedure Laterality Date  . HERNIA REPAIR    . KNEE ARTHROSCOPY    . TENDON REPAIR     r/acilles   History   Social History  . Marital Status: Married    Spouse Name: N/A  . Number of Children: 2   Occupational History  . Nurse [employer]   Social History Main Topics  . Smoking status: Never Smoker   . Smokeless tobacco: No  . Alcohol Use: Yes, beer, liquor 2-3x a week - 3 drinks  . Drug Use: No   Current Outpatient Medications on File Prior to Visit  Medication Sig Dispense Refill  . Dulaglutide (TRULICITY) 1.5 MG/0.5ML SOPN Inject 1.5 mg into the skin once a week. 4 pen 11  . EPINEPHrine (EPIPEN) 0.3 mg/0.3 mL SOAJ injection Inject 0.3 mg into the muscle once.    . fenofibrate (TRICOR) 48 MG tablet Take 145 mg by mouth at bedtime.     . fish oil-omega-3 fatty acids 1000 MG capsule Take 1 g by mouth every  morning.    Marland Kitchen JARDIANCE 25 MG TABS tablet TAKE 1 TABLET BY MOUTH ONCE DAILY 30 tablet 11  . lisinopril-hydrochlorothiazide (PRINZIDE,ZESTORETIC) 20-12.5 MG per tablet Take 1 tablet by mouth at bedtime.    . metFORMIN (GLUCOPHAGE-XR) 500 MG 24 hr tablet TAKE 2 TABLETS (1,000 MG TOTAL) BY MOUTH 2 TIMES DAILY. 360 tablet 3  . Probiotic Product (PROBIOTIC DAILY) CAPS Take 1 capsule by mouth daily.    . rosuvastatin (CRESTOR) 40 MG tablet Take 1 tablet by mouth daily.     No current facility-administered medications on file prior to visit.   Allergies  Allergen Reactions  . Bee Venom Anaphylaxis  . Ivp Dye [Iodinated Diagnostic Agents] Hives  . Januvia [Sitagliptin] Other (See Comments)    Induces acid reflux, no appetite   . Fluorescein Rash   Family History  Problem Relation Age of Onset  . Diabetes Mother   . Hyperlipidemia Mother   . Hypertension Mother   . Diabetes Father   . Hyperlipidemia Father   . Hypertension Father   . Cancer Other   . Hypertension Maternal Grandmother   . Hyperlipidemia Maternal Grandmother   .  Cancer Maternal Grandmother        Lung cancer  . Hyperlipidemia Maternal Grandfather   . Hypertension Maternal Grandfather   . CVA Maternal Grandfather   . Cancer Maternal Grandfather        Lung cancer  . Heart disease Paternal Grandmother        MI   . Stroke Paternal Grandmother   . Hypertension Paternal Grandmother   . Diabetes Paternal Grandfather   . Hyperlipidemia Paternal Grandfather    PE: BP (!) 150/90   Pulse 72   Ht 5\' 10"  (1.778 m)   Wt 220 lb (99.8 kg)   SpO2 97%   BMI 31.57 kg/m  Body mass index is 31.57 kg/m. Wt Readings from Last 3 Encounters:  04/15/20 220 lb (99.8 kg)  11/22/19 221 lb (100.2 kg)  07/19/19 223 lb (101.2 kg)   Constitutional: overweight, in NAD Eyes: PERRLA, EOMI, no exophthalmos ENT: moist mucous membranes, no thyromegaly, no cervical lymphadenopathy Cardiovascular: RRR, No MRG Respiratory: CTA  B Gastrointestinal: abdomen soft, NT, ND, BS+ Musculoskeletal: no deformities, strength intact in all 4 Skin: moist, warm, no rashes Neurological: no tremor with outstretched hands, DTR normal in all 4  ASSESSMENT: 1. DM2, non-insulin-dependent, more controlled lately, with complications - CKD stage 3 - + mild DR  2. HL  3.  Obesity class I  PLAN:  1. Patient with longstanding, fairly well-controlled type 2 diabetes, on oral antidiabetic regimen with metformin extended release, SGLT2 inhibitor, and also weekly GLP-1 receptor agonist, with slightly higher blood sugars at home at last visit, without a clear explanation other than working outside and becoming more dehydrated.  We discussed about staying hydrated but we did not change his regimen at that time.  HbA1c was 7.9%, however, the HbA1c calculated from fructosamine was 6.8%, still at goal. -At today's visit, sugars are mostly at goal, however he describes significant hypoglycemia symptoms with blood sugars in the 78-88 range, which happen usually after breakfast.  Upon questioning, he has a semisolid meal in the morning, either grits or oatmeal occasionally with fruit or nuts.  We discussed about the importance of either having a solid meal or to add healthy fats to his breakfast, to delay the absorption of the blood sugar and, subsequently, reactive hypoglycemia.  I suggested either adding nuts, Chia seeds, flaxseed, or avocado.  She will try this.  If sugars continue to decrease, we may need to decrease the Trulicity dose. - I suggested to:  Patient Instructions  Please continue: - Metformin XR 1000 mg with dinner - Jardiance 25 mg daily in am - Trulicity 1.5 mg weekly  Please come back for a follow-up appointment in 3 to 4 months.  - we checked his HbA1c: 8.1% (higher, however, we will also check a fructosamine level) - advised to check sugars at different times of the day - 1x a day, rotating check times - advised for yearly eye  exams >> he is not UTD as his appointment was postponed, but he has this at the end of next month - return to clinic in 3-4 months    2. HL -Reviewed latest lipid panel from 09/2019: LDL at goal, as were the rest of the lipid fractions -He continues on Crestor 40 milligrams daily, fish oil, fenofibrate 48 mg daily  3.  Obesity class I -continue SGLT 2 inhibitor and GLP-1 receptor agonist which should also help with weight loss -Lost 2 pounds before last visit 1 more pounds since last visit -He  continues to exercise consistently  Component     Latest Ref Rng & Units 04/15/2020  Hemoglobin A1C     4.0 - 5.6 % 8.1 (A)  Fructosamine     205 - 285 umol/L 292 (H)  04/15/2020: HbA1c calculated from fructosamine is 6.57%, improved.  Carlus Pavlovristina Soliyana Mcchristian, MD PhD [facility]

## 2020-04-18 LAB — FRUCTOSAMINE: Fructosamine: 292 umol/L — ABNORMAL HIGH (ref 205–285)

## 2020-04-29 MED FILL — JARDIANCE 25 MG TABLET: 25 | 30 days supply | Qty: 30 | Fill #10

## 2020-05-05 MED FILL — LISINOPRIL-HCTZ 20-12.5 MG: 20-12.5 | 90 days supply | Qty: 180 | Fill #2

## 2020-05-12 MED FILL — FENOFIBRATE 145 MG TABS: 145 | 90 days supply | Qty: 90 | Fill #2

## 2020-05-12 MED FILL — ROSUVASTATIN CALCIUM 40 MG: 40 | 90 days supply | Qty: 90 | Fill #2

## 2020-05-30 MED FILL — JARDIANCE 25 MG TABLET: 25 | 30 days supply | Qty: 30 | Fill #11

## 2020-06-17 ENCOUNTER — Other Ambulatory Visit (HOSPITAL_BASED_OUTPATIENT_CLINIC_OR_DEPARTMENT_OTHER): Payer: Self-pay

## 2020-06-18 ENCOUNTER — Other Ambulatory Visit: Payer: Self-pay | Admitting: Internal Medicine

## 2020-06-18 MED FILL — TRULICITY 1.5 MG/0.5 ML PEN: 1.5 | 84 days supply | Qty: 6 | Fill #0

## 2020-06-20 ENCOUNTER — Other Ambulatory Visit (HOSPITAL_BASED_OUTPATIENT_CLINIC_OR_DEPARTMENT_OTHER): Payer: Self-pay

## 2020-06-29 ENCOUNTER — Other Ambulatory Visit: Payer: Self-pay | Admitting: Internal Medicine

## 2020-06-30 ENCOUNTER — Other Ambulatory Visit (HOSPITAL_COMMUNITY): Payer: Self-pay

## 2020-06-30 MED ORDER — EMPAGLIFLOZIN 25 MG PO TABS
ORAL_TABLET | Freq: Every day | ORAL | 11 refills | Status: DC
  Filled 2020-06-30: qty 30, 30d supply, fill #0
  Filled 2020-07-31: qty 30, 30d supply, fill #1
  Filled 2020-09-01: qty 30, 30d supply, fill #2
  Filled 2020-09-30: qty 30, 30d supply, fill #3
  Filled 2020-11-03: qty 30, 30d supply, fill #4
  Filled 2020-12-02: qty 30, 30d supply, fill #5
  Filled 2020-12-31: qty 30, 30d supply, fill #6
  Filled 2021-01-30: qty 30, 30d supply, fill #7
  Filled 2021-03-05: qty 30, 30d supply, fill #8
  Filled 2021-04-02: qty 30, 30d supply, fill #9
  Filled 2021-05-06: qty 30, 30d supply, fill #10
  Filled 2021-06-05: qty 30, 30d supply, fill #11

## 2020-07-03 ENCOUNTER — Other Ambulatory Visit (HOSPITAL_COMMUNITY): Payer: Self-pay

## 2020-07-03 ENCOUNTER — Other Ambulatory Visit: Payer: Self-pay | Admitting: Internal Medicine

## 2020-07-03 MED ORDER — METFORMIN HCL ER 500 MG PO TB24
ORAL_TABLET | ORAL | 3 refills | Status: DC
  Filled 2020-07-03: qty 360, 90d supply, fill #0
  Filled 2020-12-31: qty 360, 90d supply, fill #1
  Filled 2021-07-02: qty 360, 90d supply, fill #2

## 2020-07-31 ENCOUNTER — Other Ambulatory Visit (HOSPITAL_COMMUNITY): Payer: Self-pay

## 2020-08-05 ENCOUNTER — Other Ambulatory Visit (HOSPITAL_COMMUNITY): Payer: Self-pay

## 2020-08-05 MED FILL — Lisinopril & Hydrochlorothiazide Tab 20-12.5 MG: ORAL | 90 days supply | Qty: 180 | Fill #0 | Status: AC

## 2020-08-14 ENCOUNTER — Other Ambulatory Visit (HOSPITAL_COMMUNITY): Payer: Self-pay

## 2020-08-14 MED FILL — Rosuvastatin Calcium Tab 40 MG: ORAL | 90 days supply | Qty: 90 | Fill #0 | Status: AC

## 2020-08-14 MED FILL — Fenofibrate Tab 145 MG: ORAL | 90 days supply | Qty: 90 | Fill #0 | Status: AC

## 2020-08-15 ENCOUNTER — Other Ambulatory Visit: Payer: Self-pay

## 2020-08-15 ENCOUNTER — Ambulatory Visit: Payer: 59 | Admitting: Internal Medicine

## 2020-08-15 ENCOUNTER — Encounter: Payer: Self-pay | Admitting: Internal Medicine

## 2020-08-15 VITALS — BP 140/82 | HR 78 | Ht 70.0 in | Wt 220.2 lb

## 2020-08-15 DIAGNOSIS — E669 Obesity, unspecified: Secondary | ICD-10-CM | POA: Diagnosis not present

## 2020-08-15 DIAGNOSIS — N1832 Chronic kidney disease, stage 3b: Secondary | ICD-10-CM

## 2020-08-15 DIAGNOSIS — E785 Hyperlipidemia, unspecified: Secondary | ICD-10-CM

## 2020-08-15 DIAGNOSIS — E1122 Type 2 diabetes mellitus with diabetic chronic kidney disease: Secondary | ICD-10-CM

## 2020-08-15 NOTE — Progress Notes (Addendum)
Patient ID: Jonathan Dalton, male   DOB: 06/11/1965, 55 y.o.   MRN: [id number]  This visit occurred during the SARS-CoV-2 public health emergency.  Safety protocols were in place, including screening questions prior to the visit, additional usage of staff PPE, and extensive cleaning of exam room while observing appropriate contact time as indicated for disinfecting solutions.   HPI: Jonathan Dalton is a 55 y.o.-year-old male, presents for f/u for DM2, dx in 1994, non-insulin-dependent, uncontrolled, with complications (CKD stage 3, mild DR). Last visit 4 months ago.  Interim history: He is busy with his aunt being sick and did not have time to exercise consistently, as he was doing before.  He did try attention to his diet, however, sugars were sent. No increased urination, blurry vision, nausea.  Reviewed HbA1c levels: 04/15/2020: HbA1c calculated from fructosamine is 6.57%, improved. Lab Results  Component Value Date   HGBA1C 8.1 (A) 04/15/2020   HGBA1C 7.9 (A) 11/22/2019   HGBA1C 7.8 (A) 07/19/2019  11/22/2019: HbA1c calculated from fructosamine is 6.86%, slightly higher than before but still at goal. 07/19/2019: HbA1c calculated from fructosamine is 6.6%. 03/15/2019: HbA1c calculated from fructosamine is higher than before, at 7.37% 12/21/2018: HbA1c calculated from fructosamine improved to 6.4%. 10/25/2017: HbA1c calculated from fructosamine was better, at 7.3%: 06/23/2017: HbA1c calculated from the fructosamine is higher than before, at 7.5% 02/23/2017: HbA1c calculated from the fructosamine is better, at 6.1%. 10/18/2016: HbA1c calculated from the fructosamine is slightly higher, at 6.7%. 06/22/2016: HbA1c calculated from the fructosamine is 6.3%. 02/23/2016: HbA1c calculated from the fructosamine is slightly higher, 6.35%. 10/23/2015: HbA1c calculated from fructosamine is as good as before: 5.7%! 07/24/2015: HbA1c calculated from fructosamine is much lower than the one measured, at  5.7%.  Pt is on a regimen of: - Metformin XR 1000 mg 2x a day, with meals >> 1000 mg with dinner - Jardiance 25 mg daily in am - Trulicity 0.75 mg weekly -initially had indigestion and nausea but this resolved >> 1.5 mg weekly We stopped Glipizide 11/2018 He tried Januvia 2013 >> GERD, lack of appetite.  Pt checks his sugars 1-2 times a day: - am: 100, 142-145, 170 >> 107, 120-130 >> 120-175, ave 140 - 2h after b'fast: 148 >> 130s >> 78, 88-150 >> 129-162, ave 150s - before lunch:  76, 130s >> 128-133 >> 110-156, ave 130s - 2h after lunch:  105-140 >> 130s >> n/c >> 118-165, ave 140s - before dinner: 120-140 >> 115-140, 180 (snack) >> 115-155, ave 140s - 2h after dinner:  150 >> 150s >> n/c  - bedtime:  135 >> 120, 140-160 >> 120-135 >> 134-180, ave 136-140 - nighttime: n/c Lowest sugar was  55 ...  >> 78-88 >> 110; he has hypoglycemia awareness in the 80s. Highest sugar was 200 (gastroenteritis)... >> 180 >> 180  He had the Freestyle Libre CGM >> not covered anymore.  Glucometer: ReliOn  Pt's meals are: - Breakfast: oatmeal + bacon or cold cereal + 2% milk >> grits or oatmeal - Lunch: sandwich or dinner leftovers - Dinner: meat + veggies + starch - Snacks: none For exercise, he runs and also does strength exercises.  -+ Stage III CKD, last BUN/creatinine:  10/26/2019: 39/1.6 12/19/2018: 38/2.1, GFR 40, glucose 161 06/12/2018: 50/2.39, glucose 155 05/13/2017: 34/1.92, glucose 185 Lab Results  Component Value Date   BUN 31 (A) 11/13/2015   CREATININE 1.4 (A) 11/13/2015  On lisinopril 20. -+ HL; latest lipids: 10/26/2019: 112/104/38/54 12/19/2018: 146/172/37/74 06/12/2018: 169/243/34/87 07/01/2017:  131/113/35/73 11/12/2016:119/296/25/106 - PCP recommended changing to Crestor.  Lab Results  Component Value Date   CHOL 183 11/13/2015   HDL 5 (A) 11/13/2015   TRIG 253 (A) 11/13/2015  05/03/2014: 154/193/26/90  On Crestor 40, fenofibrate 48, fish oil -Most recent eye exam  10/2018: No DR, + macular edema reportedly; he saw retina specialist at [facility]. Dr. Elmer Picker.  He has a left-sided cataract. -No numbness and tingling in his feet.  He also has HTN.  12/19/2018: TSH 2.06 TSH (12/12/2017): 1.88.  At the end of 07-25-2016 his wife passed away at 23 after a craniotomy.  ROS: Constitutional: no weight gain/no weight loss, no fatigue, no subjective hyperthermia, no subjective hypothermia Eyes: no blurry vision, no xerophthalmia ENT: no sore throat, no nodules palpated in neck, no dysphagia, no odynophagia, no hoarseness Cardiovascular: no CP/no SOB/no palpitations/no leg swelling Respiratory: no cough/no SOB/no wheezing Gastrointestinal: no N/no V/no D/no C/no acid reflux Musculoskeletal: no muscle aches/no joint aches Skin: no rashes, no hair loss Neurological: no tremors/no numbness/no tingling/no dizziness  I reviewed pt's medications, allergies, PMH, social hx, family hx, and changes were documented in the history of present illness. Otherwise, unchanged from my initial visit note.  Past Medical History:  Diagnosis Date  . Chronic kidney disease    Stage III   . Diabetes mellitus without complication (HCC)   . History of seizures as a child   . Hyperlipidemia   . Hypertension    Past Surgical History:  Procedure Laterality Date  . HERNIA REPAIR    . KNEE ARTHROSCOPY    . TENDON REPAIR     r/acilles   History   Social History  . Marital Status: Married    Spouse Name: N/A  . Number of Children: 2   Occupational History  . Nurse [employer]   Social History Main Topics  . Smoking status: Never Smoker   . Smokeless tobacco: No  . Alcohol Use: Yes, beer, liquor 2-3x a week - 3 drinks  . Drug Use: No   Current Outpatient Medications on File Prior to Visit  Medication Sig Dispense Refill  . Dulaglutide 1.5 MG/0.5ML SOPN INJECT 1.5 MG INTO THE SKIN ONCE A WEEK. 6 mL 0  . empagliflozin (JARDIANCE) 25 MG TABS tablet TAKE 1 TABLET  BY MOUTH ONCE DAILY 30 tablet 11  . EPINEPHrine 0.3 mg/0.3 mL IJ SOAJ injection Inject 0.3 mg into the muscle once.    . fenofibrate (TRICOR) 145 MG tablet TAKE 1 TABLET BY MOUTH ONCE DAILY 90 tablet 3  . fenofibrate (TRICOR) 48 MG tablet Take 145 mg by mouth at bedtime.     . fish oil-omega-3 fatty acids 1000 MG capsule Take 1 g by mouth every morning.    Marland Kitchen lisinopril-hydrochlorothiazide (PRINZIDE,ZESTORETIC) 20-12.5 MG per tablet Take 1 tablet by mouth at bedtime.    Marland Kitchen lisinopril-hydrochlorothiazide (ZESTORETIC) 20-12.5 MG tablet TAKE TWO TABLETS BY MOUTH DAILY 180 tablet 3  . metFORMIN (GLUCOPHAGE-XR) 500 MG 24 hr tablet TAKE 2 TABLETS (1,000 MG TOTAL) BY MOUTH 2 TIMES DAILY. 360 tablet 3  . Probiotic Product (PROBIOTIC DAILY) CAPS Take 1 capsule by mouth daily.    . rosuvastatin (CRESTOR) 40 MG tablet Take 1 tablet by mouth daily.    . rosuvastatin (CRESTOR) 40 MG tablet TAKE 1 TABLET BY MOUTH ONCE DAILY 90 tablet 3   No current facility-administered medications on file prior to visit.   Allergies  Allergen Reactions  . Bee Venom Anaphylaxis  .  Ivp Dye [Iodinated Diagnostic Agents] Hives  . Januvia [Sitagliptin] Other (See Comments)    Induces acid reflux, no appetite   . Fluorescein Rash   Family History  Problem Relation Age of Onset  . Diabetes Mother   . Hyperlipidemia Mother   . Hypertension Mother   . Diabetes Father   . Hyperlipidemia Father   . Hypertension Father   . Cancer Other   . Hypertension Maternal Grandmother   . Hyperlipidemia Maternal Grandmother   . Cancer Maternal Grandmother        Lung cancer  . Hyperlipidemia Maternal Grandfather   . Hypertension Maternal Grandfather   . CVA Maternal Grandfather   . Cancer Maternal Grandfather        Lung cancer  . Heart disease Paternal Grandmother        MI   . Stroke Paternal Grandmother   . Hypertension Paternal Grandmother   . Diabetes Paternal Grandfather   . Hyperlipidemia Paternal Grandfather     PE: BP 140/82 (BP Location: Right Arm, Patient Position: Sitting, Cuff Size: Normal)   Pulse 78   Ht 5\' 10"  (1.778 m)   Wt 220 lb 3.2 oz (99.9 kg)   SpO2 98%   BMI 31.60 kg/m  Body mass index is 31.6 kg/m. Wt Readings from Last 3 Encounters:  08/15/20 220 lb 3.2 oz (99.9 kg)  04/15/20 220 lb (99.8 kg)  11/22/19 221 lb (100.2 kg)   Constitutional: overweight, in NAD Eyes: PERRLA, EOMI, no exophthalmos ENT: moist mucous membranes, no thyromegaly, no cervical lymphadenopathy Cardiovascular: RRR, No MRG Respiratory: CTA B Gastrointestinal: abdomen soft, NT, ND, BS+ Musculoskeletal: no deformities, strength intact in all 4 Skin: moist, warm, no rashes Neurological: no tremor with outstretched hands, DTR normal in all 4  ASSESSMENT: 1. DM2, non-insulin-dependent, more controlled lately, with complications - CKD stage 3 - + mild DR  2. HL  3.  Obesity class I  PLAN:  1. Patient with longstanding, fairly well-controlled type 2 diabetes, on oral antidiabetic regimen with metformin, SGLT2 inhibitor and also weekly GLP-1 receptor agonist.  At last visit, he had hypoglycemic symptoms with blood sugars in the 70s to 80s after breakfast after having a semisolid meal with possible reactive hypoglycemia.  I suggested adding healthy fats to the meal but did not change the regimen at that time.  We discussed that if the sugars continue to decrease, to let me know, to decrease the Trulicity dose.  HbA1c calculated from fructosamine at last visit was 6.57%, at goal. -At today's visit, sugars are worse than before, especially before meals, due to the lack of consistent exercise while taking care of his, who was sick.  She is now getting better so he is prepared to restart exercise.  Therefore, for now, we did not change the regimen but we did discuss that in 1 month, if sugars are not better, to let me know, in which case, we can increase the Trulicity dose to 3 mg weekly.  He is tolerating the  lower dose well. - I suggested to:  Patient Instructions  Please continue: - Metformin XR 1000 mg with dinner - Jardiance 25 mg daily in am - Trulicity 1.5 mg weekly  In 1 month, if sugars remain high, let me know so we can increase Trulicity to 3 mg weekly.  Please come back for a follow-up appointment in 4 months.  - we will check a fructosamine - advised to check sugars at different times of the day - 1x  a day, rotating check times - advised for yearly eye exams >> he is not UTD - return to clinic in 3-4 months   2. HL -Reviewed latest lipid panel from 09/2019: LDL at goal, as were the rest of his lipid fractions -He continues on Crestor 40, fenofibrate 48, and fish oil, without side effects  3.  Obesity class I -continue SGLT 2 inhibitor and GLP-1 receptor agonist which should also help with weight loss -Not exercising now, but he has been adjusting his diet so he did not gain weight since last visit -He will restart to exercise consistently  Office Visit on 08/15/2020  Component Date Value Ref Range Status  . Fructosamine 08/15/2020 318* 205 - 285 umol/L Final  HbA1c calculated from fructosamine is 7%.  Carlus Pavlov, MD PhD [facility]

## 2020-08-15 NOTE — Patient Instructions (Addendum)
Please continue: - Metformin XR 1000 mg with dinner - Jardiance 25 mg daily in am - Trulicity 1.5 mg weekly  In 1 month, if sugars remain high, let me know so we can increase Trulicity to 3 mg weekly.  Please come back for a follow-up appointment in 4 months.

## 2020-08-19 LAB — FRUCTOSAMINE: Fructosamine: 318 umol/L — ABNORMAL HIGH (ref 205–285)

## 2020-09-01 ENCOUNTER — Other Ambulatory Visit (HOSPITAL_COMMUNITY): Payer: Self-pay

## 2020-09-08 ENCOUNTER — Other Ambulatory Visit: Payer: Self-pay | Admitting: Internal Medicine

## 2020-09-08 ENCOUNTER — Other Ambulatory Visit (HOSPITAL_COMMUNITY): Payer: Self-pay

## 2020-09-10 ENCOUNTER — Other Ambulatory Visit: Payer: Self-pay | Admitting: Internal Medicine

## 2020-09-10 ENCOUNTER — Other Ambulatory Visit (HOSPITAL_COMMUNITY): Payer: Self-pay

## 2020-09-12 ENCOUNTER — Other Ambulatory Visit (HOSPITAL_COMMUNITY): Payer: Self-pay

## 2020-09-12 MED ORDER — TRULICITY 1.5 MG/0.5ML ~~LOC~~ SOAJ
1.5000 mg | SUBCUTANEOUS | 0 refills | Status: DC
  Filled 2020-09-12: qty 6, 84d supply, fill #0

## 2020-09-30 ENCOUNTER — Other Ambulatory Visit (HOSPITAL_COMMUNITY): Payer: Self-pay

## 2020-10-15 ENCOUNTER — Other Ambulatory Visit (HOSPITAL_COMMUNITY): Payer: Self-pay

## 2020-10-15 DIAGNOSIS — N1832 Chronic kidney disease, stage 3b: Secondary | ICD-10-CM | POA: Diagnosis not present

## 2020-10-15 DIAGNOSIS — U071 COVID-19: Secondary | ICD-10-CM | POA: Diagnosis not present

## 2020-10-15 DIAGNOSIS — E785 Hyperlipidemia, unspecified: Secondary | ICD-10-CM | POA: Diagnosis not present

## 2020-10-15 DIAGNOSIS — E1122 Type 2 diabetes mellitus with diabetic chronic kidney disease: Secondary | ICD-10-CM | POA: Diagnosis not present

## 2020-10-15 MED ORDER — PAXLOVID 10 X 150 MG & 10 X 100MG PO TBPK
ORAL_TABLET | ORAL | 0 refills | Status: AC
  Filled 2020-10-15: qty 20, 5d supply, fill #0

## 2020-11-03 ENCOUNTER — Other Ambulatory Visit (HOSPITAL_COMMUNITY): Payer: Self-pay

## 2020-11-05 ENCOUNTER — Other Ambulatory Visit (HOSPITAL_COMMUNITY): Payer: Self-pay

## 2020-11-05 MED ORDER — LISINOPRIL-HYDROCHLOROTHIAZIDE 20-12.5 MG PO TABS
ORAL_TABLET | ORAL | 0 refills | Status: DC
  Filled 2020-11-05: qty 180, 90d supply, fill #0

## 2020-11-20 ENCOUNTER — Other Ambulatory Visit (HOSPITAL_COMMUNITY): Payer: Self-pay

## 2020-11-20 MED ORDER — FENOFIBRATE 145 MG PO TABS
145.0000 mg | ORAL_TABLET | Freq: Every day | ORAL | 0 refills | Status: DC
  Filled 2020-11-20: qty 90, 90d supply, fill #0

## 2020-11-21 ENCOUNTER — Other Ambulatory Visit (HOSPITAL_COMMUNITY): Payer: Self-pay

## 2020-11-24 ENCOUNTER — Other Ambulatory Visit (HOSPITAL_COMMUNITY): Payer: Self-pay

## 2020-11-24 MED ORDER — ROSUVASTATIN CALCIUM 40 MG PO TABS
ORAL_TABLET | ORAL | 0 refills | Status: DC
  Filled 2020-11-24: qty 30, 30d supply, fill #0

## 2020-11-26 DIAGNOSIS — N1832 Chronic kidney disease, stage 3b: Secondary | ICD-10-CM | POA: Diagnosis not present

## 2020-11-26 DIAGNOSIS — I10 Essential (primary) hypertension: Secondary | ICD-10-CM | POA: Diagnosis not present

## 2020-11-26 DIAGNOSIS — Z1211 Encounter for screening for malignant neoplasm of colon: Secondary | ICD-10-CM | POA: Diagnosis not present

## 2020-11-26 DIAGNOSIS — E785 Hyperlipidemia, unspecified: Secondary | ICD-10-CM | POA: Diagnosis not present

## 2020-11-26 DIAGNOSIS — E1122 Type 2 diabetes mellitus with diabetic chronic kidney disease: Secondary | ICD-10-CM | POA: Diagnosis not present

## 2020-11-27 ENCOUNTER — Other Ambulatory Visit (HOSPITAL_COMMUNITY): Payer: Self-pay

## 2020-11-27 MED ORDER — FENOFIBRATE 145 MG PO TABS
ORAL_TABLET | ORAL | 1 refills | Status: DC
  Filled 2021-02-25: qty 90, 90d supply, fill #0
  Filled 2021-05-27: qty 90, 90d supply, fill #1

## 2020-11-27 MED ORDER — LISINOPRIL-HYDROCHLOROTHIAZIDE 20-12.5 MG PO TABS
ORAL_TABLET | ORAL | 1 refills | Status: DC
  Filled 2020-11-27 – 2021-02-05 (×2): qty 180, 90d supply, fill #0
  Filled 2021-05-06: qty 180, 90d supply, fill #1

## 2020-11-27 MED ORDER — ROSUVASTATIN CALCIUM 40 MG PO TABS
ORAL_TABLET | ORAL | 1 refills | Status: DC
  Filled 2020-11-27 – 2020-12-25 (×2): qty 90, 90d supply, fill #0
  Filled 2021-04-02: qty 90, 90d supply, fill #1

## 2020-12-02 ENCOUNTER — Other Ambulatory Visit: Payer: Self-pay | Admitting: Internal Medicine

## 2020-12-02 ENCOUNTER — Other Ambulatory Visit (HOSPITAL_COMMUNITY): Payer: Self-pay

## 2020-12-03 ENCOUNTER — Other Ambulatory Visit (HOSPITAL_COMMUNITY): Payer: Self-pay

## 2020-12-03 MED FILL — Dulaglutide Soln Auto-injector 1.5 MG/0.5ML: SUBCUTANEOUS | 84 days supply | Qty: 6 | Fill #0 | Status: AC

## 2020-12-17 ENCOUNTER — Encounter: Payer: Self-pay | Admitting: Internal Medicine

## 2020-12-18 ENCOUNTER — Ambulatory Visit: Payer: 59 | Admitting: Internal Medicine

## 2020-12-18 ENCOUNTER — Encounter: Payer: Self-pay | Admitting: Internal Medicine

## 2020-12-18 ENCOUNTER — Other Ambulatory Visit: Payer: Self-pay

## 2020-12-18 VITALS — BP 128/72 | HR 69 | Ht 70.0 in | Wt 223.8 lb

## 2020-12-18 DIAGNOSIS — E785 Hyperlipidemia, unspecified: Secondary | ICD-10-CM

## 2020-12-18 DIAGNOSIS — E669 Obesity, unspecified: Secondary | ICD-10-CM | POA: Diagnosis not present

## 2020-12-18 DIAGNOSIS — N1832 Chronic kidney disease, stage 3b: Secondary | ICD-10-CM | POA: Diagnosis not present

## 2020-12-18 DIAGNOSIS — E1122 Type 2 diabetes mellitus with diabetic chronic kidney disease: Secondary | ICD-10-CM

## 2020-12-18 LAB — POCT GLYCOSYLATED HEMOGLOBIN (HGB A1C): Hemoglobin A1C: 8.5 % — AB (ref 4.0–5.6)

## 2020-12-18 NOTE — Patient Instructions (Addendum)
Please continue: - Metformin XR 1000 mg with dinner - Jardiance 25 mg daily in am  Please increase: - Trulicity 3 mg weekly  Let me know if we need to send the 3 mg dose after you finish the low dose supply.  Please come back for a follow-up appointment in 4 months.

## 2020-12-18 NOTE — Progress Notes (Signed)
Patient ID: XAYNE BRUMBAUGH, male   DOB: January 03, 1966, 55 y.o.   MRN: [id number]  This visit occurred during the SARS-CoV-2 public health emergency.  Safety protocols were in place, including screening questions prior to the visit, additional usage of staff PPE, and extensive cleaning of exam room while observing appropriate contact time as indicated for disinfecting solutions.   HPI: ZAIAH ECKERSON is a 55 y.o.-year-old male, presents for f/u for DM2, dx in 1994, non-insulin-dependent, uncontrolled, with complications (CKD stage 3, mild DR). Last visit 4 months ago.  Interim history: He had COVID-19 in 09/2020: mm aches, congestion, HA, fever - sugars higher: 200s. Took Paxlovid. No increased urination, blurry vision, nausea, chest pain.  Reviewed HbA1c levels: 08/15/2020: HbA1c calculated from fructosamine is 7%. 04/15/2020: HbA1c calculated from fructosamine is 6.57%, improved. Lab Results  Component Value Date   HGBA1C 8.1 (A) 04/15/2020   HGBA1C 7.9 (A) 11/22/2019   HGBA1C 7.8 (A) 07/19/2019  11/22/2019: HbA1c calculated from fructosamine is 6.86%, slightly higher than before but still at goal. 07/19/2019: HbA1c calculated from fructosamine is 6.6%. 03/15/2019: HbA1c calculated from fructosamine is higher than before, at 7.37% 12/21/2018: HbA1c calculated from fructosamine improved to 6.4%. 10/25/2017: HbA1c calculated from fructosamine was better, at 7.3%: 06/23/2017: HbA1c calculated from the fructosamine is higher than before, at 7.5% 02/23/2017: HbA1c calculated from the fructosamine is better, at 6.1%. 10/18/2016: HbA1c calculated from the fructosamine is slightly higher, at 6.7%. 06/22/2016: HbA1c calculated from the fructosamine is 6.3%. 02/23/2016: HbA1c calculated from the fructosamine is slightly higher, 6.35%. 10/23/2015: HbA1c calculated from fructosamine is as good as before: 5.7%! 07/24/2015: HbA1c calculated from fructosamine is much lower than the one measured, at 5.7%.  Pt  is on a regimen of: - Metformin XR 1000 mg 2x a day, with meals >> 1000 mg with dinner - Jardiance 25 mg daily in am - Trulicity 0.75 mg weekly -initially had indigestion and nausea but this resolved >> 1.5 mg weekly We stopped Glipizide 11/2018 He tried Januvia 2013 >> GERD, lack of appetite.  Pt checks his sugars 1-2 times a day: - am: 100, 142-145, 170 >> 107, 120-130 >> 120-175, ave 140 >> 165-170 - 2h after b'fast: 148 >> 130s >> 78, 88-150 >> 129-162, ave 150s >>   - before lunch:  128-133 >> 110-156, ave 130s >>  100, 134-145 - 2h after lunch:  105-140 >> 130s >> n/c >> 118-165, ave 140s >> ? - before dinner: 115-140, 180 (snack) >> 115-155, ave 140s >> 134-145 - 2h after dinner:  150 >> 150s >> n/c  - bedtime:  120, 140-160 >> 120-135 >> 134-180, ave 136-140 >> 160-165 - nighttime: n/c Lowest sugar was  55 ...  >> 78-88 >> 110 >> 100; he has hypoglycemia awareness in the 80s. Highest sugar was 200 (gastroenteritis)... >> 180 >> 180 >> 220  He had the Freestyle Libre CGM >> not covered anymore.  Glucometer: ReliOn  Pt's meals are: - Breakfast: oatmeal + bacon or cold cereal + 2% milk >> grits or oatmeal - Lunch: sandwich or dinner leftovers - Dinner: meat + veggies + starch - Snacks: none For exercise, he runs and also does strength exercises.  -+ Stage III CKD, last BUN/creatinine:  11/26/2020: 36/2.06, GFR 37, glucose 93 10/26/2019: 39/1.6 12/19/2018: 38/2.1, GFR 40, glucose 161 06/12/2018: 50/2.39, glucose 155 05/13/2017: 34/1.92, glucose 185 Lab Results  Component Value Date   BUN 31 (A) 11/13/2015   CREATININE 1.4 (A) 11/13/2015  On lisinopril 20.  -+  HL; latest lipids: 11/26/2020: 187/111/46/90 10/26/2019: 112/104/38/54 12/19/2018: 146/172/37/74 06/12/2018: 169/243/34/87 07/01/2017: 131/113/35/73 11/12/2016:119/296/25/106 - PCP recommended changing to Crestor.  Lab Results  Component Value Date   CHOL 183 11/13/2015   HDL 5 (A) 11/13/2015   TRIG 253 (A)  11/13/2015  05/03/2014: 154/193/26/90  On Crestor 40, fenofibrate 48, fish oil  -Most recent eye exam 10/2018: No DR, + macular edema reportedly; he saw retina specialist at [facility]. Dr. Elmer Picker.  He has a left-sided cataract.  -No numbness and tingling in his feet.  He also has HTN.  12/19/2018: TSH 2.06 TSH (12/12/2017): 1.88.  At the end of 2016-07-25 his wife passed away at 54 after a craniotomy.  ROS: Constitutional: no weight gain/no weight loss, no fatigue, no subjective hyperthermia, no subjective hypothermia Eyes: no blurry vision, no xerophthalmia ENT: no sore throat, no nodules palpated in neck, no dysphagia, no odynophagia, no hoarseness Cardiovascular: no CP/no SOB/no palpitations/no leg swelling Respiratory: no cough/no SOB/no wheezing Gastrointestinal: no N/no V/no D/no C/no acid reflux Musculoskeletal: no muscle aches/no joint aches Skin: no rashes, no hair loss Neurological: no tremors/no numbness/no tingling/no dizziness  I reviewed pt's medications, allergies, PMH, social hx, family hx, and changes were documented in the history of present illness. Otherwise, unchanged from my initial visit note.  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     fenofibrate (TRICOR) 145 MG tablet Take 1 tablet by mouth once daily 90 tablet 1   fenofibrate (TRICOR) 48 MG tablet Take 145 mg by mouth at bedtime.      fish oil-omega-3 fatty acids 1000 MG capsule Take 1 g by mouth every morning.     lisinopril-hydrochlorothiazide (PRINZIDE,ZESTORETIC) 20-12.5 MG per tablet Take 1 tablet by mouth at bedtime.     lisinopril-hydrochlorothiazide (ZESTORETIC) 20-12.5 MG tablet Take 2 tablets by mouth daily 180 tablet 1   metFORMIN (GLUCOPHAGE-XR) 500 MG 24 hr tablet TAKE 2 TABLETS (1,000 MG TOTAL) BY MOUTH 2 TIMES DAILY. 360 tablet 3   nirmatrelvir/ritonavir EUA, renal dosing, (PAXLOVID) TBPK Take 2 tablets by mouth 2 times a day for 5 days 20 tablet 0   Probiotic Product (PROBIOTIC DAILY) CAPS Take 1 capsule by mouth daily.     rosuvastatin (CRESTOR) 40 MG tablet TAKE 1 TABLET BY MOUTH ONCE DAILY 90 tablet 3   rosuvastatin (CRESTOR) 40 MG tablet Take 1 tablet by mouth daily 90 tablet 1   Dulaglutide (TRULICITY)  1.5 MG/0.5ML SOPN INJECT 1.5 MG INTO THE SKIN ONCE A WEEK. 6 mL 3   No current facility-administered medications on file prior to visit.   Allergies  Allergen Reactions   Bee Venom Anaphylaxis   Ivp Dye [Iodinated Diagnostic Agents] Hives   Januvia [Sitagliptin] Other (See Comments)    Induces acid reflux, no appetite    Fluorescein Rash   Family History  Problem Relation Age of Onset   Diabetes Mother    Hyperlipidemia Mother    Hypertension Mother    Diabetes Father    Hyperlipidemia Father    Hypertension Father    Cancer Other    Hypertension Maternal Grandmother    Hyperlipidemia Maternal Grandmother    Cancer Maternal Grandmother        Lung cancer   Hyperlipidemia Maternal Grandfather    Hypertension Maternal Grandfather    CVA Maternal Grandfather    Cancer Maternal Grandfather        Lung cancer    Heart disease Paternal Grandmother        MI    Stroke Paternal Grandmother    Hypertension Paternal Grandmother    Diabetes Paternal Grandfather    Hyperlipidemia Paternal Grandfather    PE: BP 128/72 (BP Location: Left Arm, Patient Position: Sitting, Cuff Size: Normal)   Pulse 69   Ht 5\' 10"  (1.778 m)   Wt 223 lb 12.8 oz (101.5 kg)   SpO2 99%   BMI 32.11 kg/m  Body mass index is 32.11 kg/m. Wt Readings from Last 3 Encounters:  12/18/20 223 lb 12.8 oz (101.5 kg)  08/15/20 220 lb 3.2 oz (99.9 kg)  04/15/20 220 lb (99.8 kg)   Constitutional: overweight, in NAD Eyes: PERRLA, EOMI, no exophthalmos ENT: moist mucous membranes, no thyromegaly, no cervical lymphadenopathy Cardiovascular: RRR, No MRG Respiratory: CTA B Gastrointestinal: abdomen soft, NT, ND, BS+ Musculoskeletal: no deformities, strength intact in all 4 Skin: moist, warm, no rashes Neurological: no tremor with outstretched hands, DTR normal in all 4  ASSESSMENT: 1. DM2, non-insulin-dependent, more controlled lately, with complications - CKD stage 3 - + mild DR  2. HL  3.  Obesity class I  PLAN:  1. Patient with longstanding, fairly well-controlled type 2 diabetes, on oral antidiabetic regimen with metformin, SGLT2 inhibitor and weekly GLP-1 receptor agonist, whom we are following with fructosamine levels rather than directly measured HbA1c due to better accuracy of the former.  Latest HbA1c obtained from fructosamine at last visit was 7.0%, higher. -At last visit, sugars were worse than before especially before meals, due to lack of consistent exercise while taking care of his aunt, who was sick.  At that time, he was preparing to start back exercising.  We did not change the regimen but I did advise him that when he was ready to refill the Trulicity 1.5 mg dose to let me know so I can call in the 3 mg dose for him.  At this visit, however, he remains on the 1.5 mg weekly. -At today's visit, sugars are higher,  they initially increased when he had COVID 19 2 months ago and they remain more fluctuating now.  We discussed about increasing the Trulicity dose to 3 mg weekly.  He tolerates the lower dose without GI side effects we can continue the same dose of metformin and Jardiance. -We discussed that at next visit we can maybe switch to [facility], which is more potent than Trulicity, if needed. - I suggested to:  Patient Instructions  Please  continue: - Metformin XR 1000 mg with dinner - Jardiance 25 mg daily in am  Please increase: - Trulicity 3 mg weekly  Let me know if we need to send the 3 mg dose after you finish the low dose supply.  Please come back for a follow-up appointment in 4 months.  - we we will check a fructosamine level at next OV; the directly measured HbA1c 8.5 (higher) - advised to check sugars at different times of the day - 1x a day, rotating check times - advised for yearly eye exams >> he is not UTD - return to clinic in 4 months  2. HL -Reviewed latest lipid panel from 10/2020: LDL above our target, increased from before -He continues on Crestor 40 mg daily, fenofibrate 48 mg daily and fish oil, without side effects  3.  Obesity class I -continue SGLT 2 inhibitor and GLP-1 receptor agonist which should also help with weight loss.  We will increase the Trulicity dose for now. -At last visit he was planning to restart exercising consistently -At today's visit, weight is higher by 3 pounds  Carlus Pavlov, MD PhD [facility] Endocrinology

## 2020-12-25 ENCOUNTER — Other Ambulatory Visit (HOSPITAL_COMMUNITY): Payer: Self-pay

## 2020-12-31 ENCOUNTER — Other Ambulatory Visit (HOSPITAL_COMMUNITY): Payer: Self-pay

## 2021-01-13 ENCOUNTER — Encounter: Payer: Self-pay | Admitting: Internal Medicine

## 2021-01-13 ENCOUNTER — Other Ambulatory Visit (HOSPITAL_COMMUNITY): Payer: Self-pay

## 2021-01-13 ENCOUNTER — Other Ambulatory Visit: Payer: Self-pay | Admitting: Internal Medicine

## 2021-01-13 MED ORDER — TRULICITY 3 MG/0.5ML ~~LOC~~ SOAJ
3.0000 mg | SUBCUTANEOUS | 3 refills | Status: DC
  Filled 2021-01-13 – 2021-01-26 (×2): qty 6, 84d supply, fill #0
  Filled 2021-04-20: qty 6, 84d supply, fill #1

## 2021-01-21 ENCOUNTER — Other Ambulatory Visit (HOSPITAL_COMMUNITY): Payer: Self-pay

## 2021-01-26 ENCOUNTER — Other Ambulatory Visit (HOSPITAL_COMMUNITY): Payer: Self-pay

## 2021-01-30 ENCOUNTER — Other Ambulatory Visit (HOSPITAL_COMMUNITY): Payer: Self-pay

## 2021-02-05 ENCOUNTER — Other Ambulatory Visit (HOSPITAL_COMMUNITY): Payer: Self-pay

## 2021-02-25 ENCOUNTER — Other Ambulatory Visit (HOSPITAL_COMMUNITY): Payer: Self-pay

## 2021-03-05 ENCOUNTER — Other Ambulatory Visit (HOSPITAL_COMMUNITY): Payer: Self-pay

## 2021-04-02 ENCOUNTER — Other Ambulatory Visit (HOSPITAL_COMMUNITY): Payer: Self-pay

## 2021-04-20 ENCOUNTER — Other Ambulatory Visit (HOSPITAL_COMMUNITY): Payer: Self-pay

## 2021-04-20 ENCOUNTER — Ambulatory Visit: Payer: 59 | Admitting: Internal Medicine

## 2021-04-21 ENCOUNTER — Encounter: Payer: Self-pay | Admitting: Internal Medicine

## 2021-04-21 ENCOUNTER — Other Ambulatory Visit (HOSPITAL_COMMUNITY): Payer: Self-pay

## 2021-04-21 ENCOUNTER — Ambulatory Visit: Payer: 59 | Admitting: Internal Medicine

## 2021-04-21 ENCOUNTER — Other Ambulatory Visit: Payer: Self-pay

## 2021-04-21 VITALS — BP 128/78 | HR 68 | Ht 70.0 in | Wt 217.2 lb

## 2021-04-21 DIAGNOSIS — E785 Hyperlipidemia, unspecified: Secondary | ICD-10-CM

## 2021-04-21 DIAGNOSIS — N1832 Chronic kidney disease, stage 3b: Secondary | ICD-10-CM | POA: Diagnosis not present

## 2021-04-21 DIAGNOSIS — E1122 Type 2 diabetes mellitus with diabetic chronic kidney disease: Secondary | ICD-10-CM | POA: Diagnosis not present

## 2021-04-21 DIAGNOSIS — E669 Obesity, unspecified: Secondary | ICD-10-CM | POA: Diagnosis not present

## 2021-04-21 LAB — POCT GLYCOSYLATED HEMOGLOBIN (HGB A1C): Hemoglobin A1C: 8.2 % — AB (ref 4.0–5.6)

## 2021-04-21 MED ORDER — TRULICITY 4.5 MG/0.5ML ~~LOC~~ SOAJ
4.5000 mg | SUBCUTANEOUS | 3 refills | Status: AC
  Filled 2021-04-21: qty 6, 84d supply, fill #0
  Filled 2021-07-13: qty 6, 84d supply, fill #1
  Filled 2021-10-12: qty 6, 84d supply, fill #2

## 2021-04-21 NOTE — Patient Instructions (Addendum)
Please continue: - Metformin XR 1000 mg with dinner - Jardiance 25 mg daily in am  Try to increase: - Trulicity 4.5 mg weekly  Please come back for a follow-up appointment in 4 months.

## 2021-04-21 NOTE — Progress Notes (Signed)
Patient ID: Jonathan Dalton, male   DOB: 1965-05-28, 56 y.o.   MRN: [id number]  This visit occurred during the SARS-CoV-2 public health emergency.  Safety protocols were in place, including screening questions prior to the visit, additional usage of staff PPE, and extensive cleaning of exam room while observing appropriate contact time as indicated for disinfecting solutions.   HPI: Jonathan Dalton is a 56 y.o.-year-old male, presents for f/u for DM2, dx in 1994, non-insulin-dependent, uncontrolled, with complications (CKD stage 3, mild DR). Last visit 4 months ago.  Interim history: No increased urination, blurry vision, nausea, chest pain. He is preparing to go on a 4 day cruise -he is worried that he may have seasickness.  Reviewed HbA1c levels: Lab Results  Component Value Date   HGBA1C 8.5 (A) 12/18/2020   HGBA1C 8.1 (A) 04/15/2020   HGBA1C 7.9 (A) 11/22/2019  08/15/2020: HbA1c calculated from fructosamine is 7%. 04/15/2020: HbA1c calculated from fructosamine is 6.57%, improved. 11/22/2019: HbA1c calculated from fructosamine is 6.86%, slightly higher than before but still at goal. 07/19/2019: HbA1c calculated from fructosamine is 6.6%. 03/15/2019: HbA1c calculated from fructosamine is higher than before, at 7.37% 12/21/2018: HbA1c calculated from fructosamine improved to 6.4%. 10/25/2017: HbA1c calculated from fructosamine was better, at 7.3%: 06/23/2017: HbA1c calculated from the fructosamine is higher than before, at 7.5% 02/23/2017: HbA1c calculated from the fructosamine is better, at 6.1%. 10/18/2016: HbA1c calculated from the fructosamine is slightly higher, at 6.7%. 06/22/2016: HbA1c calculated from the fructosamine is 6.3%. 02/23/2016: HbA1c calculated from the fructosamine is slightly higher, 6.35%. 10/23/2015: HbA1c calculated from fructosamine is as good as before: 5.7%! 07/24/2015: HbA1c calculated from fructosamine is much lower than the one measured, at 5.7%.  Pt is on a  regimen of: - Metformin XR 1000 mg 2x a day, with meals >> 1000 mg with dinner - Jardiance 25 mg daily in am - Trulicity [id number] mg weekly -initially had indigestion and nausea but this resolved >> 1.5 >> 3 mg weekly We stopped Glipizide 11/2018 He tried Januvia 2013 >> GERD, lack of appetite.  Pt checks his sugars 1-2 times a day: - am:107, 120-130 >> 120-175, ave 140 >> 165-170 >> 130-148, 158 - 2h after b'fast:130s >> 78, 88-150 >> 129-162, ave 150s >>  138-158 - before lunch:  110-156, ave 130s >>  100, 134-145 >> 148-167 - 2h after lunch: 130s >> n/c >> 118-165, ave 140s >> ? >> 143-152 - before dinner:  115-155, ave 140s >> 134-145 >> 128-150, 158 - 2h after dinner:  150 >> 150s >> n/c  >> 138-143 - bedtime:   120-135 >> 134-180, ave 136-140 >> 160-165 >> 130-160 - nighttime: n/c Lowest sugar was  55 ...  >> 100 >> 128; he has hypoglycemia awareness in the 80s. Highest sugar was 220>> 160  He had the Freestyle Libre CGM >> not covered anymore.  Glucometer: ReliOn  Pt's meals are: - Breakfast: oatmeal + bacon or cold cereal + 2% milk >> grits or oatmeal - Lunch: sandwich or dinner leftovers - Dinner: meat + veggies + starch - Snacks: none For exercise, he runs and also does strength exercises.  -+ Stage III CKD, last BUN/creatinine:  11/26/2020: 36/2.06, GFR 37, glucose 93 10/26/2019: 39/1.6 12/19/2018: 38/2.1, GFR 40, glucose 161 06/12/2018: 50/2.39, glucose 155 05/13/2017: 34/1.92, glucose 185 Lab Results  Component Value Date   BUN 31 (A) 11/13/2015   CREATININE 1.4 (A) 11/13/2015  On lisinopril 20.  -+ HL; latest lipids: 11/26/2020: 187/111/46/90 10/26/2019: 112/104/38/54  12/19/2018: 146/172/37/74 06/12/2018: 169/243/34/87 07/01/2017: 131/113/35/73 11/12/2016:119/296/25/106 - PCP recommended changing to Crestor.  Lab Results  Component Value Date   CHOL 183 11/13/2015   HDL 5 (A) 11/13/2015   TRIG 253 (A) 11/13/2015  05/03/2014: 154/193/26/90  On Crestor 40,  fenofibrate 48, fish oil  -Most recent eye exam 10/2018: No DR, + macular edema reportedly; he saw retina specialist at [facility]. Dr. Elmer Picker.  He has a left-sided cataract.  -No numbness and tingling in his feet.  He also has HTN.  12/19/2018: TSH 2.06 TSH (12/12/2017): 1.88.  He had COVID-19 in 09/2020: mm aches, congestion, HA, fever - sugars higher: 200s. Took Paxlovid.  At the end of 27-Jul-2016 his wife passed away at 77 after a craniotomy.  He has remarried.  ROS: + see HPI  I reviewed pt's medications, allergies, PMH, social hx, family hx, and changes were documented in the history of present illness. Otherwise, unchanged from my initial visit note.  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 6 mL 3   empagliflozin (JARDIANCE) 25 MG TABS tablet TAKE 1 TABLET BY MOUTH ONCE DAILY 30 tablet 11   EPINEPHrine 0.3 mg/0.3 mL IJ SOAJ injection Inject 0.3 mg into the muscle once.     fenofibrate (TRICOR) 145 MG tablet Take 1 tablet by mouth once daily 90 tablet 1   fenofibrate (TRICOR) 48 MG tablet Take 145 mg by mouth at bedtime.      fish oil-omega-3 fatty acids 1000 MG capsule Take 1 g by mouth every morning.      lisinopril-hydrochlorothiazide (PRINZIDE,ZESTORETIC) 20-12.5 MG per tablet Take 1 tablet by mouth at bedtime.     lisinopril-hydrochlorothiazide (ZESTORETIC) 20-12.5 MG tablet Take 2 tablets by mouth daily 180 tablet 1   metFORMIN (GLUCOPHAGE-XR) 500 MG 24 hr tablet TAKE 2 TABLETS (1,000 MG TOTAL) BY MOUTH 2 TIMES DAILY. 360 tablet 3   nirmatrelvir/ritonavir EUA, renal dosing, (PAXLOVID) TBPK Take 2 tablets by mouth 2 times a day for 5 days 20 tablet 0   Probiotic Product (PROBIOTIC DAILY) CAPS Take 1 capsule by mouth daily.     rosuvastatin (CRESTOR) 40 MG tablet Take 1 tablet by mouth daily 90 tablet 1   No current facility-administered medications on file prior to visit.   Allergies  Allergen Reactions   Bee Venom Anaphylaxis   Ivp Dye [Iodinated Contrast Media] Hives   Januvia [Sitagliptin] Other (See Comments)    Induces acid reflux, no appetite    Fluorescein Rash   Family History  Problem Relation Age  of Onset   Diabetes Mother    Hyperlipidemia Mother    Hypertension Mother    Diabetes Father    Hyperlipidemia Father    Hypertension Father    Cancer Other    Hypertension Maternal Grandmother    Hyperlipidemia Maternal Grandmother    Cancer Maternal Grandmother        Lung cancer   Hyperlipidemia Maternal Grandfather    Hypertension Maternal Grandfather    CVA Maternal Grandfather    Cancer Maternal Grandfather        Lung cancer   Heart disease Paternal Grandmother        MI    Stroke Paternal Grandmother    Hypertension Paternal Grandmother    Diabetes Paternal Grandfather    Hyperlipidemia Paternal Grandfather    PE: BP 128/78 (BP Location: Right Arm, Patient Position: Sitting, Cuff Size: Normal)    Pulse 68    Ht 5\' 10"  (1.778 m)    Wt 217 lb 3.2 oz (98.5 kg)    SpO2 99%    BMI 31.16 kg/m   Wt Readings from Last 3 Encounters:  04/21/21 217 lb 3.2 oz (98.5 kg)  12/18/20 223 lb 12.8 oz (101.5 kg)  08/15/20 220 lb 3.2 oz (99.9 kg)   Constitutional:  overweight, in NAD Eyes: PERRLA, EOMI, no exophthalmos ENT: moist mucous membranes, no thyromegaly, no cervical lymphadenopathy Cardiovascular: RRR, No MRG Respiratory: CTA B Musculoskeletal: no deformities, strength intact in all 4 Skin: moist, warm, no rashes Neurological: no tremor with outstretched hands, DTR normal in all 4 Diabetic Foot Exam - Simple   Simple Foot Form Diabetic Foot exam was performed with the following findings: Yes 04/21/2021  3:26 PM  Visual Inspection No deformities, no ulcerations, no other skin breakdown bilaterally: Yes Sensation Testing Intact to touch and monofilament testing bilaterally: Yes Pulse Check Posterior Tibialis and Dorsalis pulse intact bilaterally: Yes Comments    ASSESSMENT: 1. DM2, non-insulin-dependent, more controlled lately, with complications - CKD stage 3 - + mild DR  2. HL  3.  Obesity class I  PLAN:  1. Patient with longstanding, fairly well-controlled type 2 diabetes, on oral antidiabetic regimen with metformin, SGLT2 inhibitor, and weekly GLP-1 receptor agonist, dose increased at last visit.  At that time, HbA1c was 8.5%, slightly higher than before.  However, the HbA1c calculated from fructosamine is usually more accurate for him.  -At last visit, sugars are higher, initially increased after having had COVID-19 but remained more fluctuating afterwards.  We increased the Trulicity dose to 3 mg weekly.  We will continue the same dose of metformin and Jardiance. -At today's visit, sugars appear to be quite stable, fluctuating within a very narrow range before and after meals.  His sugars before meals are slightly higher than target while they are excellent after meals.  At this visit, we discussed about continuing Trulicity, which she tolerates well.  However, he had problems obtaining this 3 mg dose since this is on backorder at the pharmacy.  Therefore, especially since fasting sugars are higher, I suggested to increase the dose  to 4.5 mg weekly.  I am hoping that he can obtain this.  We will continue metformin and Jardiance at the same doses. - I suggested to:  Patient Instructions  Please continue: - Metformin XR 1000 mg with dinner - Jardiance 25 mg daily in am  Try to increase: - Trulicity 4.5 mg weekly  Please come back for a follow-up appointment in 4 months.  - we checked  his HbA1c: 8.2% (improved, but higher than expected from his long) -we will not check a fructosamine level today - advised to check sugars at different times of the day - 1x a day, rotating check times - advised for yearly eye exams >> he is not UTD - return to clinic in 4 months  2. HL -Reviewed latest lipid panel from 10/2020: LDL above target, increased from before -He continues on Crestor 40 mg daily, fenofibrate 48 mg daily and fish oil, without side effects  3.  Obesity class I -continue SGLT 2 inhibitor and GLP-1 receptor agonist which should also help with weight loss -Before last visit, he gained 3 pounds -He lost 6 pounds since then  Philemon Kingdom, MD PhD [facility] Endocrinology

## 2021-04-22 ENCOUNTER — Other Ambulatory Visit (HOSPITAL_COMMUNITY): Payer: Self-pay

## 2021-05-06 ENCOUNTER — Other Ambulatory Visit (HOSPITAL_COMMUNITY): Payer: Self-pay

## 2021-05-27 ENCOUNTER — Other Ambulatory Visit (HOSPITAL_COMMUNITY): Payer: Self-pay

## 2021-06-05 ENCOUNTER — Other Ambulatory Visit (HOSPITAL_COMMUNITY): Payer: Self-pay

## 2021-06-10 ENCOUNTER — Other Ambulatory Visit (HOSPITAL_COMMUNITY): Payer: Self-pay

## 2021-06-10 DIAGNOSIS — E6609 Other obesity due to excess calories: Secondary | ICD-10-CM | POA: Diagnosis not present

## 2021-06-10 DIAGNOSIS — Z0000 Encounter for general adult medical examination without abnormal findings: Secondary | ICD-10-CM | POA: Diagnosis not present

## 2021-06-10 DIAGNOSIS — E1122 Type 2 diabetes mellitus with diabetic chronic kidney disease: Secondary | ICD-10-CM | POA: Diagnosis not present

## 2021-06-10 DIAGNOSIS — I10 Essential (primary) hypertension: Secondary | ICD-10-CM | POA: Diagnosis not present

## 2021-06-10 DIAGNOSIS — E785 Hyperlipidemia, unspecified: Secondary | ICD-10-CM | POA: Diagnosis not present

## 2021-06-10 DIAGNOSIS — Z1211 Encounter for screening for malignant neoplasm of colon: Secondary | ICD-10-CM | POA: Diagnosis not present

## 2021-06-10 DIAGNOSIS — Z91038 Other insect allergy status: Secondary | ICD-10-CM | POA: Diagnosis not present

## 2021-06-10 DIAGNOSIS — Z125 Encounter for screening for malignant neoplasm of prostate: Secondary | ICD-10-CM | POA: Diagnosis not present

## 2021-06-10 DIAGNOSIS — Z23 Encounter for immunization: Secondary | ICD-10-CM | POA: Diagnosis not present

## 2021-06-10 DIAGNOSIS — N1832 Chronic kidney disease, stage 3b: Secondary | ICD-10-CM | POA: Diagnosis not present

## 2021-06-10 MED ORDER — LISINOPRIL-HYDROCHLOROTHIAZIDE 20-12.5 MG PO TABS
ORAL_TABLET | ORAL | 1 refills | Status: AC
  Filled 2021-06-10: qty 180, 90d supply, fill #0
  Filled 2021-08-05: qty 131, 66d supply, fill #0
  Filled 2021-08-05: qty 49, 24d supply, fill #0

## 2021-06-10 MED ORDER — FENOFIBRATE 145 MG PO TABS
ORAL_TABLET | ORAL | 1 refills | Status: AC
  Filled 2021-08-25: qty 60, 60d supply, fill #0
  Filled 2021-08-25: qty 30, 30d supply, fill #0

## 2021-06-10 MED ORDER — ROSUVASTATIN CALCIUM 40 MG PO TABS
ORAL_TABLET | ORAL | 1 refills | Status: AC
  Filled 2021-06-10: qty 90, 90d supply, fill #0
  Filled 2021-10-12: qty 90, 90d supply, fill #1

## 2021-06-10 MED ORDER — EPINEPHRINE 0.3 MG/0.3ML IJ SOAJ
INTRAMUSCULAR | 0 refills | Status: AC
  Filled 2021-06-10: qty 2, 30d supply, fill #0

## 2021-06-13 ENCOUNTER — Other Ambulatory Visit (HOSPITAL_COMMUNITY): Payer: Self-pay

## 2021-07-02 ENCOUNTER — Other Ambulatory Visit: Payer: Self-pay | Admitting: Internal Medicine

## 2021-07-03 ENCOUNTER — Other Ambulatory Visit (HOSPITAL_COMMUNITY): Payer: Self-pay

## 2021-07-05 MED ORDER — EMPAGLIFLOZIN 25 MG PO TABS
ORAL_TABLET | Freq: Every day | ORAL | 5 refills | Status: DC
  Filled 2021-07-05: qty 30, 30d supply, fill #0
  Filled 2021-08-05: qty 30, 30d supply, fill #1
  Filled 2021-09-02: qty 30, 30d supply, fill #2
  Filled 2021-10-12: qty 30, 30d supply, fill #3

## 2021-07-06 ENCOUNTER — Other Ambulatory Visit (HOSPITAL_COMMUNITY): Payer: Self-pay

## 2021-07-13 ENCOUNTER — Other Ambulatory Visit (HOSPITAL_COMMUNITY): Payer: Self-pay

## 2021-08-05 ENCOUNTER — Other Ambulatory Visit (HOSPITAL_COMMUNITY): Payer: Self-pay

## 2021-08-20 ENCOUNTER — Encounter: Payer: Self-pay | Admitting: Internal Medicine

## 2021-08-20 ENCOUNTER — Ambulatory Visit: Payer: 59 | Admitting: Internal Medicine

## 2021-08-20 VITALS — BP 120/76 | HR 62 | Ht 70.0 in | Wt 213.6 lb

## 2021-08-20 DIAGNOSIS — E785 Hyperlipidemia, unspecified: Secondary | ICD-10-CM | POA: Diagnosis not present

## 2021-08-20 DIAGNOSIS — N1832 Chronic kidney disease, stage 3b: Secondary | ICD-10-CM

## 2021-08-20 DIAGNOSIS — E1122 Type 2 diabetes mellitus with diabetic chronic kidney disease: Secondary | ICD-10-CM

## 2021-08-20 DIAGNOSIS — E669 Obesity, unspecified: Secondary | ICD-10-CM | POA: Diagnosis not present

## 2021-08-20 NOTE — Patient Instructions (Addendum)
Please continue: - Metformin XR 1000 mg with dinner - Jardiance 25 mg daily in am - Trulicity 4.5 mg weekly  Please come back for a follow-up appointment in 4-6 months.

## 2021-08-20 NOTE — Progress Notes (Addendum)
Patient ID: Jonathan Dalton, male   DOB: 02/17/1966, 56 y.o.   MRN: [id number]  This visit occurred during the SARS-CoV-2 public health emergency.  Safety protocols were in place, including screening questions prior to the visit, additional usage of staff PPE, and extensive cleaning of exam room while observing appropriate contact time as indicated for disinfecting solutions.   HPI: Jonathan Dalton is a 56 y.o.-year-old male, presents for f/u for DM2, dx in 1994, non-insulin-dependent, uncontrolled, with complications (CKD stage 3, mild DR). Last visit 4 months ago.  Interim history: No increased urination, blurry vision, nausea, chest pain. He was in a cruise after our last visit.  He took Dramamine while on the ship and he did not have seasickness.  However, after he stopped Dramamine, he had nausea, vomiting, and dizziness.  Reviewed HbA1c levels: Lab Results  Component Value Date   HGBA1C 8.2 (A) 04/21/2021   HGBA1C 8.5 (A) 12/18/2020   HGBA1C 8.1 (A) 04/15/2020  08/15/2020: HbA1c calculated from fructosamine is 7%. 04/15/2020: HbA1c calculated from fructosamine is 6.57%, improved. 11/22/2019: HbA1c calculated from fructosamine is 6.86%, slightly higher than before but still at goal. 07/19/2019: HbA1c calculated from fructosamine is 6.6%. 03/15/2019: HbA1c calculated from fructosamine is higher than before, at 7.37% 12/21/2018: HbA1c calculated from fructosamine improved to 6.4%. 10/25/2017: HbA1c calculated from fructosamine was better, at 7.3%: 06/23/2017: HbA1c calculated from the fructosamine is higher than before, at 7.5% 02/23/2017: HbA1c calculated from the fructosamine is better, at 6.1%. 10/18/2016: HbA1c calculated from the fructosamine is slightly higher, at 6.7%. 06/22/2016: HbA1c calculated from the fructosamine is 6.3%. 02/23/2016: HbA1c calculated from the fructosamine is slightly higher, 6.35%. 10/23/2015: HbA1c calculated from fructosamine is as good as before:  5.7%! 07/24/2015: HbA1c calculated from fructosamine is much lower than the one measured, at 5.7%.  Pt is on a regimen of: - Metformin XR 1000 mg 2x a day, with meals >> 1000 mg with dinner - Jardiance 25 mg daily in am - Trulicity [id number] mg weekly -initially had indigestion and nausea but this resolved >> 1.5 >> 3 >> 4.5 mg weekly We stopped Glipizide 11/2018 He tried Januvia 2013 >> GERD, lack of appetite.  Pt checks his sugars 1-2 times a day: - am: 120-175 >> 165-170 >> 130-148, 158 >> 110-140, 170 - 2h after b'fast:  129-162, ave 150s >>  138-158 >> 120-150 - before lunch:  100, 134-145 >> 148-167 >> 100-170 (coffee) - 2h after lunch: 118-165, ave 140s >> ? >> 143-152 >> 120-160 - before dinner:  > 134-145 >> 128-150, 158 >> 110-150 - 2h after dinner:  150 >> 150s >> n/c  >> 138-143 - bedtime: 8 134-180, ave 136-140 >> 160-165 >> 130-160 - nighttime: n/c Lowest sugar was  55 ...  >> 100 >> 128 >> 100; he has hypoglycemia awareness in the 80s. Highest sugar was 220 >> 160 >> 200 - gastroenteritis.  He had the Freestyle Libre CGM >> not covered anymore.  Glucometer: ReliOn  Pt's meals are: - Breakfast: oatmeal + bacon or cold cereal + 2% milk >> grits or oatmeal - Lunch: sandwich or dinner leftovers - Dinner: meat + veggies + starch - Snacks: none For exercise, he runs and also does strength exercises.  -+ Stage III CKD, last BUN/creatinine:   2021-06-10     Albumin 4.8   3.4-4.8  ALP 25   38-126  ALT 21   0-52  Anion Gap 12.3   6.0-20.0  AST 20   0-39  BUN 41   6-26  CO2 28   22-32  CA-corrected 9.18   8.60-10.30  Calcium 10.0   8.6-10.3  Chloride 103   98-107  Creatinine 1.99   0.60-1.30  eGFR2021 38   >60  Glucose 108   70-99  Potassium 4.4   3.5-5.5  Sodium 139   136-145  Protein, Total 7.4   6.0-8.3  TBIL 0.4   0.3-1.0   11/26/2020: 36/2.06, GFR 37, glucose 93 10/26/2019: 39/1.6 12/19/2018: 38/2.1, GFR 40, glucose 161 06/12/2018: 50/2.39, glucose  155 05/13/2017: 34/1.92, glucose 185 Lab Results  Component Value Date   BUN 31 (A) 11/13/2015   CREATININE 1.4 (A) 11/13/2015  On lisinopril 20.  -+ HL; latest lipids: Lipid Panel w/reflex   2021-06-10    LDL Chol Calc (NIH) 49   0-99  CHOL/HDL 2.6   2.0-4.0  Cholesterol 110   <200  HDLD 43   30-70  LDL Chol Calc (NIH) 49   0-99  NHDL 67   0-129  Triglyceride 91   0-199   11/26/2020: 187/111/46/90 10/26/2019: 112/104/38/54 12/19/2018: 146/172/37/74 06/12/2018: 169/243/34/87 07/01/2017: 131/113/35/73 11/12/2016:119/296/25/106 - PCP recommended changing to Crestor.  Lab Results  Component Value Date   CHOL 183 11/13/2015   HDL 5 (A) 11/13/2015   TRIG 253 (A) 11/13/2015  05/03/2014: 154/193/26/90  On Crestor 40, fenofibrate 48, fish oil  -Most recent eye exam 10/2018: No DR, + macular edema reportedly; he saw retina specialist at [facility]. Dr. Herbert Deaner.  He has a left-sided cataract. Coming up 10/14/2021.  -No numbness and tingling in his feet.  Last foot exam 03/2021.  He also has HTN.  Last TSH:    2021-06-10    TSH 1.44   0.34-4.50   He had COVID-19 in 09/2020: mm aches, congestion, HA, fever - sugars higher: 200s. Took Paxlovid.  At the end of 2016/07/20 his wife passed away at 32 after a craniotomy.  He has remarried.  ROS: + see HPI  I reviewed pt's medications, allergies, PMH, social hx, family hx, and changes were documented in the history of present illness. Otherwise, unchanged from my initial visit note.  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 6 mL 3   empagliflozin (JARDIANCE) 25 MG TABS tablet TAKE 1 TABLET BY MOUTH ONCE DAILY 30 tablet 5   EPINEPHrine 0.3 mg/0.3 mL IJ SOAJ injection Inject 0.3 mg into the muscle once.     EPINEPHrine 0.3 mg/0.3 mL IJ SOAJ injection Use as directed 2 each 0   fenofibrate (TRICOR)  145 MG tablet Take 1 tablet by mouth once a day 90 tablet 1   fenofibrate (TRICOR) 48 MG tablet Take 145 mg by mouth at bedtime.      fish oil-omega-3 fatty acids 1000 MG capsule Take 1 g by mouth every morning.     lisinopril-hydrochlorothiazide (PRINZIDE,ZESTORETIC) 20-12.5 MG per tablet Take 1 tablet by mouth at bedtime.     lisinopril-hydrochlorothiazide (ZESTORETIC) 20-12.5 MG tablet Take 2 tablets by mouth once a day 180 tablet 1   metFORMIN (GLUCOPHAGE-XR) 500 MG 24 hr tablet TAKE 2 TABLETS (1,000 MG TOTAL) BY MOUTH 2 TIMES DAILY. 360 tablet 3   nirmatrelvir/ritonavir EUA, renal dosing, (PAXLOVID) TBPK Take 2 tablets by mouth 2 times a day for 5 days 20 tablet 0   Probiotic Product (PROBIOTIC DAILY) CAPS Take 1 capsule by mouth daily.     rosuvastatin (CRESTOR) 40 MG tablet Take 1 tablet by mouth once a day 90 tablet 1   No current facility-administered medications on file prior to visit.   Allergies  Allergen Reactions   Bee Venom Anaphylaxis   Ivp Dye [Iodinated Contrast Media] Hives   Januvia [Sitagliptin] Other (See Comments)    Induces acid reflux, no appetite    Fluorescein Rash   Family History  Problem Relation Age of Onset   Diabetes Mother    Hyperlipidemia Mother    Hypertension Mother    Diabetes Father    Hyperlipidemia Father    Hypertension  Father    Cancer Other    Hypertension Maternal Grandmother    Hyperlipidemia Maternal Grandmother    Cancer Maternal Grandmother        Lung cancer   Hyperlipidemia Maternal Grandfather    Hypertension Maternal Grandfather    CVA Maternal Grandfather    Cancer Maternal Grandfather        Lung cancer   Heart disease Paternal Grandmother        MI    Stroke Paternal Grandmother    Hypertension Paternal Grandmother    Diabetes Paternal Grandfather    Hyperlipidemia Paternal Grandfather    PE: BP 120/76 (BP Location: Left Arm, Patient Position: Sitting, Cuff Size: Normal)   Pulse 62   Ht 5\' 10"  (1.778 m)   Wt 213 lb 9.6 oz (96.9 kg)   SpO2 99%   BMI 30.65 kg/m   Wt Readings from Last 3 Encounters:  08/20/21 213 lb 9.6 oz (96.9 kg)  04/21/21 217 lb 3.2 oz (98.5 kg)  12/18/20 223 lb 12.8 oz (101.5 kg)   Constitutional: overweight, in NAD Eyes: PERRLA, EOMI, no exophthalmos ENT: moist mucous membranes, no thyromegaly, no cervical lymphadenopathy Cardiovascular: RRR, No MRG Respiratory: CTA B Musculoskeletal: no deformities, strength intact in all 4 Skin: moist, warm, no rashes Neurological: no tremor with outstretched hands, DTR normal in all 4  ASSESSMENT: 1. DM2, non-insulin-dependent, more controlled lately, with complications - CKD stage 3 - + mild DR  2. HL  3.  Obesity class I  PLAN:  1. Patient with longstanding, fairly well-controlled type 2 diabetes, on oral antidiabetic regimen with metformin extended release, SGLT2 inhibitor and weekly GLP-1 receptor agonist, dose increased at last visit.  At that time, HbA1c was 8.2%, lower than before, but higher than expected from his log.  We previously followed him with fructosamine levels, which are usually more accurate for him.  We did not check a fructosamine at last visit, though.  At that time, sugars are fluctuating within a very narrow range before or after  meals with slightly higher than goal preprandial blood  sugars and excellent postprandial blood sugars.  We increased Trulicity but did not change the rest of the regimen. -At today's visit, sugars are not much changed from before -they again fluctuate within a narrow range, with higher blood sugars before meals, possibly related to coffee or snacks or distress.  After meals, the majority of the blood sugars are at goal.  I do not feel we need to change his regimen for now. - I suggested to:  Patient Instructions  Please continue: - Metformin XR 1000 mg with dinner - Jardiance 25 mg daily in am - Trulicity 4.5 mg weekly  Please come back for a follow-up appointment in 4-6 months.  - we checked his HbA1c: 8.2% (stable)-we will also check a fructosamine level - advised to check sugars at different times of the day - 1x a day, rotating check times - advised for yearly eye exams >> he is not UTD but has an appointment coming up - return to clinic in 4-6 months  2. HL -Reviewed latest lipid panel from 06/2021: LDL at target now, much improved, the rest of the fractions also at goal -He continues on Crestor 40 mg daily, fenofibrate 45 mg daily and fish oil, without side effects  3.  Obesity class I -continue SGLT 2 inhibitor and GLP-1 receptor agonist which should also help with weight loss -he lost 6 pounds before last visit and 4 more since then  Component     Latest Ref Rng 08/20/2021  Fructosamine     205 - 285 umol/L 313 (H)     HbA1c calculated from fructosamine: 6.9%, improved.  Philemon Kingdom, MD PhD [facility]

## 2021-08-25 ENCOUNTER — Other Ambulatory Visit (HOSPITAL_COMMUNITY): Payer: Self-pay

## 2021-08-26 LAB — FRUCTOSAMINE: Fructosamine: 313 umol/L — ABNORMAL HIGH (ref 205–285)

## 2021-09-02 ENCOUNTER — Other Ambulatory Visit (HOSPITAL_COMMUNITY): Payer: Self-pay

## 2021-10-12 ENCOUNTER — Other Ambulatory Visit (HOSPITAL_COMMUNITY): Payer: Self-pay

## 2021-10-13 ENCOUNTER — Other Ambulatory Visit (HOSPITAL_COMMUNITY): Payer: Self-pay

## 2021-11-25 ENCOUNTER — Other Ambulatory Visit (HOSPITAL_COMMUNITY): Payer: Self-pay

## 2021-12-07 ENCOUNTER — Telehealth: Payer: Self-pay

## 2021-12-07 ENCOUNTER — Other Ambulatory Visit (HOSPITAL_COMMUNITY): Payer: Self-pay

## 2021-12-07 NOTE — Telephone Encounter (Signed)
Patient Advocate Encounter  Prior Authorization for Trulicity 4.5MG /0.5ML pen-injectors has been approved.     Effective: 12-07-2021 to 12-08-2022  Test claim returns a $45.00 co-pay

## 2021-12-07 NOTE — Telephone Encounter (Addendum)
Patient Advocate Encounter   Received notification from [employer] that prior authorization is required for Trulicity 4.5MG /0.5ML pen-injectors  Submitted: 12-07-2021 Key [id number]  Status is pending

## 2021-12-22 ENCOUNTER — Ambulatory Visit: Payer: Self-pay | Admitting: Internal Medicine

## 2021-12-22 NOTE — Progress Notes (Deleted)
Patient ID: SOVANN BATRA, male   DOB: 1965-08-21, 56 y.o.   MRN: [id number]  HPI: KATHRYN HUBANKS is a 56 y.o.-year-old male, presents for f/u for DM2, dx in 1994, non-insulin-dependent, uncontrolled, with complications (CKD stage 3, mild DR). Last visit 8 months ago.  Interim history: No increased urination, blurry vision, nausea, chest pain.  Reviewed HbA1c levels: 08/20/2021: HbA1c calculated from fructosamine: 6.9%, improved. Lab Results  Component Value Date   HGBA1C 8.2 (A) 04/21/2021   HGBA1C 8.5 (A) 12/18/2020   HGBA1C 8.1 (A) 04/15/2020  08/15/2020: HbA1c calculated from fructosamine is 7%. 04/15/2020: HbA1c calculated from fructosamine is 6.57%, improved. 11/22/2019: HbA1c calculated from fructosamine is 6.86%, slightly higher than before but still at goal. 07/19/2019: HbA1c calculated from fructosamine is 6.6%. 03/15/2019: HbA1c calculated from fructosamine is higher than before, at 7.37% 12/21/2018: HbA1c calculated from fructosamine improved to 6.4%. 10/25/2017: HbA1c calculated from fructosamine was better, at 7.3%: 06/23/2017: HbA1c calculated from the fructosamine is higher than before, at 7.5% 02/23/2017: HbA1c calculated from the fructosamine is better, at 6.1%. 10/18/2016: HbA1c calculated from the fructosamine is slightly higher, at 6.7%. 06/22/2016: HbA1c calculated from the fructosamine is 6.3%. 02/23/2016: HbA1c calculated from the fructosamine is slightly higher, 6.35%. 10/23/2015: HbA1c calculated from fructosamine is as good as before: 5.7%! 07/24/2015: HbA1c calculated from fructosamine is much lower than the one measured, at 5.7%.  Pt is on a regimen of: - Metformin XR 1000 mg 2x a day, with meals >> 1000 mg with dinner - Jardiance 25 mg daily in am - Trulicity A999333 mg weekly -initially had indigestion and nausea but this resolved >> 1.5 >> 3 >> 4.5 mg weekly We stopped Glipizide 11/2018 He tried Januvia 2013 >> GERD, lack of appetite.  Pt checks his sugars  1-2 times a day: - am: 120-175 >> 165-170 >> 130-148, 158 >> 110-140, 170 - 2h after b'fast:  129-162, ave 150s >>  138-158 >> 120-150 - before lunch:  100, 134-145 >> 148-167 >> 100-170 (coffee) - 2h after lunch: 118-165, ave 140s >> ? >> 143-152 >> 120-160 - before dinner:  > 134-145 >> 128-150, 158 >> 110-150 - 2h after dinner:  150 >> 150s >> n/c  >> 138-143 - bedtime: 8 134-180, ave 136-140 >> 160-165 >> 130-160 - nighttime: n/c Lowest sugar was  55 ...  >> 100 >> 128 >> 100; he has hypoglycemia awareness in the 80s. Highest sugar was 220 >> 160 >> 200 - gastroenteritis.  He had the Freestyle Libre CGM >> not covered anymore.  Glucometer: ReliOn  Pt's meals are: - Breakfast: oatmeal + bacon or cold cereal + 2% milk >> grits or oatmeal - Lunch: sandwich or dinner leftovers - Dinner: meat + veggies + starch - Snacks: none For exercise, he runs and also does strength exercises.  -+ Stage III CKD, last BUN/creatinine:   2021-06-10     Albumin 4.8   3.4-4.8  ALP 25   38-126  ALT 21   0-52  Anion Gap 12.3   6.0-20.0  AST 20   0-39  BUN 41   6-26  CO2 28   22-32  CA-corrected 9.18   8.60-10.30  Calcium 10.0   8.6-10.3  Chloride 103   98-107  Creatinine 1.99   0.60-1.30  eGFR2021 38   >60  Glucose 108   70-99  Potassium 4.4   3.5-5.5  Sodium 139   136-145  Protein, Total 7.4   6.0-8.3  TBIL 0.4   0.3-1.0  11/26/2020: 36/2.06, GFR 37, glucose 93 10/26/2019: 39/1.6 12/19/2018: 38/2.1, GFR 40, glucose 161 06/12/2018: 50/2.39, glucose 155 05/13/2017: 34/1.92, glucose 185 Lab Results  Component Value Date   BUN 31 (A) 11/13/2015   CREATININE 1.4 (A) 11/13/2015  On lisinopril 20.  -+ HL; latest lipids: Lipid Panel w/reflex   2021-06-10    LDL Chol Calc (NIH) 49   0-99  CHOL/HDL 2.6   2.0-4.0  Cholesterol 110   <200  HDLD 43   30-70  LDL Chol Calc (NIH) 49   0-99  NHDL 67   0-129  Triglyceride 91   0-199  11/26/2020: 187/111/46/90 10/26/2019: 112/104/38/54 12/19/2018:  146/172/37/74 06/12/2018: 169/243/34/87 2017-07-11: 131/113/35/73 11/12/2016:119/296/25/106 - PCP recommended changing to Crestor.  Lab Results  Component Value Date   CHOL 183 11/13/2015   HDL 5 (A) 11/13/2015   TRIG 253 (A) 11/13/2015  05/03/2014: 154/193/26/90  On Crestor 40, fenofibrate 48, fish oil  -Most recent eye exam 10/2018: No DR, + macular edema reportedly; he saw retina specialist at [facility]. Dr. Herbert Deaner.  He has a left-sided cataract. Coming up 10/14/2021.  -No numbness and tingling in his feet.  Last foot exam 03/2021.  He also has HTN.  Last TSH:    2021-06-10    TSH 1.44   0.34-4.50   He had COVID-19 in 09/2020: mm aches, congestion, HA, fever - sugars higher: 200s. Took Paxlovid.  At the end of 07/11/16 his wife passed away at 57 after a craniotomy.  He has remarried.  ROS: + see HPI  I reviewed pt's medications, allergies, PMH, social hx, family hx, and changes were documented in the history of present illness. Otherwise, unchanged from my initial visit note.  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 6 mL 3   empagliflozin (JARDIANCE) 25 MG TABS tablet TAKE 1 TABLET BY MOUTH ONCE DAILY 30 tablet 5    EPINEPHrine 0.3 mg/0.3 mL IJ SOAJ injection Inject 0.3 mg into the muscle once.     EPINEPHrine 0.3 mg/0.3 mL IJ SOAJ injection Use as directed 2 each 0   fenofibrate (TRICOR) 145 MG tablet Take 1 tablet by mouth once a day 90 tablet 1   fenofibrate (TRICOR) 48 MG tablet Take 145 mg by mouth at bedtime.      fish oil-omega-3 fatty acids 1000 MG capsule Take 1 g by mouth every morning.     lisinopril-hydrochlorothiazide (PRINZIDE,ZESTORETIC) 20-12.5 MG per tablet Take 1 tablet by mouth at bedtime.     lisinopril-hydrochlorothiazide (ZESTORETIC) 20-12.5 MG tablet Take 2  tablets by mouth once a day 180 tablet 1   metFORMIN (GLUCOPHAGE-XR) 500 MG 24 hr tablet TAKE 2 TABLETS (1,000 MG TOTAL) BY MOUTH 2 TIMES DAILY. 360 tablet 3   nirmatrelvir/ritonavir EUA, renal dosing, (PAXLOVID) TBPK Take 2 tablets by mouth 2 times a day for 5 days 20 tablet 0   Probiotic Product (PROBIOTIC DAILY) CAPS Take 1 capsule by mouth daily.     rosuvastatin (CRESTOR) 40 MG tablet Take 1 tablet by mouth once a day 90 tablet 1   No current facility-administered medications on file prior to visit.   Allergies  Allergen Reactions   Bee Venom Anaphylaxis   Ivp Dye [Iodinated Contrast Media] Hives   Januvia [Sitagliptin] Other (See Comments)    Induces acid reflux, no appetite    Fluorescein Rash   Family History  Problem Relation Age of Onset   Diabetes Mother    Hyperlipidemia Mother    Hypertension Mother    Diabetes Father    Hyperlipidemia Father    Hypertension Father    Cancer Other    Hypertension Maternal Grandmother    Hyperlipidemia Maternal Grandmother    Cancer Maternal Grandmother        Lung cancer   Hyperlipidemia Maternal Grandfather    Hypertension Maternal Grandfather    CVA Maternal Grandfather    Cancer Maternal Grandfather        Lung cancer   Heart disease Paternal Grandmother        MI    Stroke Paternal Grandmother    Hypertension Paternal Grandmother    Diabetes Paternal  Grandfather    Hyperlipidemia Paternal Grandfather    PE: There were no vitals taken for this visit.  Wt Readings from Last 3 Encounters:  08/20/21 213 lb 9.6 oz (96.9 kg)  04/21/21 217 lb 3.2 oz (98.5 kg)  12/18/20 223 lb 12.8 oz (101.5 kg)   Constitutional: overweight, in NAD Eyes: PERRLA, EOMI, no exophthalmos ENT: moist mucous membranes, no thyromegaly, no cervical lymphadenopathy Cardiovascular: RRR, No MRG Respiratory: CTA B Musculoskeletal: no deformities, strength intact in all 4 Skin: moist, warm, no rashes Neurological: no tremor with outstretched hands, DTR normal in all 4  ASSESSMENT: 1. DM2, non-insulin-dependent, more controlled lately, with complications - CKD stage 3 - + mild DR  2. HL  3.  Obesity class I  PLAN:  1. Patient with longstanding fairly well-controlled type 2 diabetes, on oral antidiabetic regimen with metformin Hx to inhibitor and also weekly GLP-1 receptor agonist, with improved blood sugars at last visit-HbA1c calculated from 2000 and was 6.9%, at goal.  At that time, sugars were not much change from before, fluctuating within a narrow range, with higher blood sugars before meals, possibly related to coffee or snacks.  She has a majority of the blood sugars were at goal, we did not change his regimen.  - I suggested to:  Patient Instructions  Please continue: - Metformin XR 1000 mg with dinner - Jardiance 25 mg daily in am - Trulicity 4.5 mg weekly  Please come back for a follow-up appointment in 4-6 months.  - we checked his HbA1c: 7%  - advised to check sugars at different times of the day - 1x a day, rotating check times - advised for yearly eye exams >> he is UTD - return to clinic in 4-6 months  2. HL - Reviewed latest lipid panel from 06/2021: LDL at target, much improved, the rest of the fractions also at goal - Continues on Crestor  40 mg daily, fenofibrate 45 mg daily, and fish oil without side effects.  3.  Obesity class  I -continue SGLT 2 inhibitor and GLP-1 receptor agonist which should also help with weight loss -he lost 10 pounds before the last 2 visits combined  Philemon Kingdom, MD PhD [facility] Endocrinology

## 2021-12-31 ENCOUNTER — Other Ambulatory Visit: Payer: Self-pay

## 2021-12-31 ENCOUNTER — Other Ambulatory Visit (HOSPITAL_COMMUNITY): Payer: Self-pay

## 2021-12-31 DIAGNOSIS — E1122 Type 2 diabetes mellitus with diabetic chronic kidney disease: Secondary | ICD-10-CM

## 2021-12-31 MED ORDER — METFORMIN HCL ER 500 MG PO TB24: ORAL_TABLET | ORAL | 3 refills | Status: AC

## 2022-01-02 ENCOUNTER — Other Ambulatory Visit: Payer: Self-pay | Admitting: Internal Medicine
